# Patient Record
Sex: Male | Born: 1965
Health system: Southern US, Community
[De-identification: ages and names within clinical notes are randomized; demographics above are authoritative.]

## PROBLEM LIST (undated history)

## (undated) DIAGNOSIS — G4730 Sleep apnea, unspecified: Secondary | ICD-10-CM

## (undated) DIAGNOSIS — E785 Hyperlipidemia, unspecified: Secondary | ICD-10-CM

## (undated) DIAGNOSIS — E669 Obesity, unspecified: Secondary | ICD-10-CM

## (undated) DIAGNOSIS — I10 Essential (primary) hypertension: Secondary | ICD-10-CM

## (undated) HISTORY — DX: Hyperlipidemia, unspecified: E78.5

## (undated) HISTORY — DX: Sleep apnea, unspecified: G47.30

## (undated) HISTORY — DX: Essential (primary) hypertension: I10

## (undated) HISTORY — DX: Obesity, unspecified: E66.9

---
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
## 2006-11-15 ENCOUNTER — Emergency Department: Payer: Self-pay | Admitting: Emergency Medicine

## 2007-02-10 ENCOUNTER — Emergency Department: Payer: Self-pay | Admitting: Emergency Medicine

## 2007-04-14 ENCOUNTER — Emergency Department: Payer: Self-pay | Admitting: Emergency Medicine

## 2007-07-01 ENCOUNTER — Inpatient Hospital Stay: Payer: Self-pay | Admitting: Internal Medicine

## 2009-03-31 ENCOUNTER — Ambulatory Visit: Payer: Self-pay | Admitting: Internal Medicine

## 2010-02-10 ENCOUNTER — Ambulatory Visit: Payer: Self-pay | Admitting: Internal Medicine

## 2010-12-26 ENCOUNTER — Ambulatory Visit: Payer: Self-pay | Admitting: Internal Medicine

## 2012-06-18 ENCOUNTER — Ambulatory Visit: Payer: Self-pay | Admitting: Internal Medicine

## 2012-08-16 ENCOUNTER — Ambulatory Visit: Payer: Self-pay

## 2012-08-26 ENCOUNTER — Ambulatory Visit: Payer: Self-pay | Admitting: Physician Assistant

## 2012-09-16 ENCOUNTER — Ambulatory Visit: Payer: Self-pay

## 2013-10-01 ENCOUNTER — Inpatient Hospital Stay: Payer: Self-pay | Admitting: Internal Medicine

## 2013-10-01 LAB — BASIC METABOLIC PANEL
Anion Gap: 7 (ref 7–16)
BUN: 14 mg/dL (ref 7–18)
Chloride: 108 mmol/L — ABNORMAL HIGH (ref 98–107)
Co2: 27 mmol/L (ref 21–32)
Creatinine: 0.91 mg/dL (ref 0.60–1.30)
EGFR (African American): >60
EGFR (Non-African Amer.): >60
Glucose: 138 mg/dL — ABNORMAL HIGH (ref 65–99)
Osmolality: 286 (ref 275–301)
Potassium: 3.5 mmol/L (ref 3.5–5.1)

## 2013-10-01 LAB — CBC
HGB: 14.4 g/dL (ref 13.0–18.0)
MCH: 32.7 pg (ref 26.0–34.0)
MCV: 97 fL (ref 80–100)
Platelet: 214 10*3/uL (ref 150–440)
RBC: 4.42 10*6/uL (ref 4.40–5.90)
RDW: 13.5 % (ref 11.5–14.5)

## 2013-10-01 LAB — CK TOTAL AND CKMB (NOT AT ARMC)
CK, Total: 122 U/L (ref 35–232)
CK, Total: 126 U/L (ref 35–232)
CK, Total: 124 U/L (ref 35–232)
CK-MB: 1.9 ng/mL (ref 0.5–3.6)
CK-MB: 1.7 ng/mL (ref 0.5–3.6)
CK-MB: 2.1 ng/mL (ref 0.5–3.6)

## 2013-10-01 LAB — PRO B NATRIURETIC PEPTIDE: B-Type Natriuretic Peptide: 357 pg/mL — ABNORMAL HIGH (ref 0–125)

## 2013-10-01 LAB — TROPONIN I: Troponin-I: <0.02 ng/mL

## 2013-10-02 LAB — COMPREHENSIVE METABOLIC PANEL
Alkaline Phosphatase: 123 U/L — ABNORMAL HIGH
Anion Gap: 6 — ABNORMAL LOW (ref 7–16)
BUN: 20 mg/dL — ABNORMAL HIGH (ref 7–18)
Calcium, Total: 8.9 mg/dL (ref 8.5–10.1)
Co2: 31 mmol/L (ref 21–32)
Creatinine: 1.13 mg/dL (ref 0.60–1.30)
EGFR (African American): >60
EGFR (Non-African Amer.): >60
Glucose: 104 mg/dL — ABNORMAL HIGH (ref 65–99)
Osmolality: 282 (ref 275–301)
Potassium: 3.2 mmol/L — ABNORMAL LOW (ref 3.5–5.1)
SGPT (ALT): 31 U/L (ref 12–78)
Sodium: 140 mmol/L (ref 136–145)

## 2013-10-02 LAB — LIPID PANEL
Cholesterol: 117 mg/dL (ref 0–200)
HDL Cholesterol: 29 mg/dL — ABNORMAL LOW (ref 40–60)
Ldl Cholesterol, Calc: 38 mg/dL (ref 0–100)
VLDL Cholesterol, Calc: 50 mg/dL — ABNORMAL HIGH (ref 5–40)

## 2013-10-09 ENCOUNTER — Ambulatory Visit: Payer: Self-pay

## 2014-03-06 LAB — COMPREHENSIVE METABOLIC PANEL
ALBUMIN: 2.6 g/dL — AB (ref 3.4–5.0)
ALK PHOS: 147 U/L — AB
ALT: 45 U/L (ref 12–78)
ANION GAP: 6 — AB (ref 7–16)
AST: 62 U/L — AB (ref 15–37)
BILIRUBIN TOTAL: 0.6 mg/dL (ref 0.2–1.0)
BUN: 29 mg/dL — ABNORMAL HIGH (ref 7–18)
CALCIUM: 8.3 mg/dL — AB (ref 8.5–10.1)
CO2: 25 mmol/L (ref 21–32)
Chloride: 104 mmol/L (ref 98–107)
Creatinine: 1.45 mg/dL — ABNORMAL HIGH (ref 0.60–1.30)
EGFR (Non-African Amer.): 57 — ABNORMAL LOW
GLUCOSE: 174 mg/dL — AB (ref 65–99)
Osmolality: 280 (ref 275–301)
Potassium: 3.3 mmol/L — ABNORMAL LOW (ref 3.5–5.1)
Sodium: 135 mmol/L — ABNORMAL LOW (ref 136–145)
Total Protein: 6.7 g/dL (ref 6.4–8.2)

## 2014-03-06 LAB — CBC
HCT: 37.7 % — AB (ref 40.0–52.0)
HGB: 12.9 g/dL — ABNORMAL LOW (ref 13.0–18.0)
MCH: 32.8 pg (ref 26.0–34.0)
MCHC: 34.2 g/dL (ref 32.0–36.0)
MCV: 96 fL (ref 80–100)
PLATELETS: 143 10*3/uL — AB (ref 150–440)
RBC: 3.93 10*6/uL — ABNORMAL LOW (ref 4.40–5.90)
RDW: 13.2 % (ref 11.5–14.5)
WBC: 7.8 10*3/uL (ref 3.8–10.6)

## 2014-03-07 ENCOUNTER — Inpatient Hospital Stay: Payer: Self-pay | Admitting: Internal Medicine

## 2014-03-07 LAB — URINALYSIS, COMPLETE
BILIRUBIN, UR: NEGATIVE
Bacteria: NONE SEEN
GLUCOSE, UR: NEGATIVE mg/dL (ref 0–75)
KETONE: NEGATIVE
Leukocyte Esterase: NEGATIVE
NITRITE: NEGATIVE
Ph: 5 (ref 4.5–8.0)
Specific Gravity: 1.023 (ref 1.003–1.030)
Squamous Epithelial: 1
WBC UR: 7 /HPF (ref 0–5)

## 2014-03-08 LAB — BASIC METABOLIC PANEL
ANION GAP: 6 — AB (ref 7–16)
BUN: 14 mg/dL (ref 7–18)
CALCIUM: 8.1 mg/dL — AB (ref 8.5–10.1)
Chloride: 104 mmol/L (ref 98–107)
Co2: 25 mmol/L (ref 21–32)
Creatinine: 1.43 mg/dL — ABNORMAL HIGH (ref 0.60–1.30)
EGFR (African American): >60
GFR CALC NON AF AMER: 58 — AB
Glucose: 109 mg/dL — ABNORMAL HIGH (ref 65–99)
Osmolality: 271 (ref 275–301)
Potassium: 3.5 mmol/L (ref 3.5–5.1)
Sodium: 135 mmol/L — ABNORMAL LOW (ref 136–145)

## 2014-03-08 LAB — CBC WITH DIFFERENTIAL/PLATELET
BASOS ABS: 0 10*3/uL (ref 0.0–0.1)
Basophil %: 0.4 %
EOS ABS: 0 10*3/uL (ref 0.0–0.7)
Eosinophil %: 0.1 %
HCT: 32.9 % — ABNORMAL LOW (ref 40.0–52.0)
HGB: 11.2 g/dL — AB (ref 13.0–18.0)
LYMPHS ABS: 1.3 10*3/uL (ref 1.0–3.6)
LYMPHS PCT: 14.1 %
MCH: 33 pg (ref 26.0–34.0)
MCHC: 34.1 g/dL (ref 32.0–36.0)
MCV: 97 fL (ref 80–100)
Monocyte #: 1.1 x10 3/mm — ABNORMAL HIGH (ref 0.2–1.0)
Monocyte %: 12 %
NEUTROS ABS: 6.7 10*3/uL — AB (ref 1.4–6.5)
Neutrophil %: 73.4 %
Platelet: 124 10*3/uL — ABNORMAL LOW (ref 150–440)
RBC: 3.4 10*6/uL — ABNORMAL LOW (ref 4.40–5.90)
RDW: 13.1 % (ref 11.5–14.5)
WBC: 9.2 10*3/uL (ref 3.8–10.6)

## 2014-03-09 LAB — VANCOMYCIN, TROUGH: Vancomycin, Trough: 9 ug/mL — ABNORMAL LOW (ref 10–20)

## 2014-03-10 LAB — CREATININE, SERUM
Creatinine: 1.14 mg/dL (ref 0.60–1.30)
EGFR (African American): >60

## 2014-03-10 LAB — VANCOMYCIN, TROUGH: Vancomycin, Trough: 13 ug/mL (ref 10–20)

## 2014-03-11 LAB — VANCOMYCIN, TROUGH: Vancomycin, Trough: 26 ug/mL (ref 10–20)

## 2014-03-12 LAB — CBC WITH DIFFERENTIAL/PLATELET
Basophil #: 0 x10 3/mm 3
Basophil %: 0.5 %
Eosinophil #: 0.1 x10 3/mm 3
Eosinophil %: 1.7 %
HCT: 32.5 % — ABNORMAL LOW
HGB: 11.1 g/dL — ABNORMAL LOW
Lymphocyte %: 20.4 %
Lymphs Abs: 1.8 x10 3/mm 3
MCH: 33.6 pg
MCHC: 34.1 g/dL
MCV: 99 fL
Monocyte #: 1.4 "x10 3/mm " — ABNORMAL HIGH
Monocyte %: 15.5 %
Neutrophil #: 5.4 x10 3/mm 3
Neutrophil %: 61.9 %
Platelet: 251 x10 3/mm 3
RBC: 3.3 x10 6/mm 3 — ABNORMAL LOW
RDW: 13.8 %
WBC: 8.8 x10 3/mm 3

## 2014-03-12 LAB — CREATININE, SERUM
Creatinine: 1.24 mg/dL
EGFR (African American): >60
EGFR (Non-African Amer.): >60

## 2014-03-12 LAB — CULTURE, BLOOD (SINGLE)

## 2014-03-25 ENCOUNTER — Ambulatory Visit: Payer: Self-pay | Admitting: Physician Assistant

## 2015-02-12 NOTE — Discharge Summary (Signed)
PATIENT NAME:  Bradley Velez, Bradley Velez MR#:  [id number] DATE OF BIRTH:  08/15/1966  DATE OF ADMISSION:  10/01/2013 DATE OF DISCHARGE:  10/02/2013  For a detailed note, please take a look at the history and physical on admission by Dr. Angelica Ranavid Hower.   DIAGNOSES AT DISCHARGE: As follows:  1.  Acute on chronic congestive heart failure, likely combined systolic and diastolic dysfunction. 2.  Hypertension. 3.  Pulmonary hypertension. 4.  Morbid obesity. 5.  Obstructive sleep apnea. 6.  Hyperlipidemia.   DIET: The patient is being discharged on a low-sodium, low-fat diet.   ACTIVITY: As tolerated.   FOLLOWUP With Dr. Arnoldo HookerBruce Kowalski and Oliver HumHeather Hiles, NP at [facility] in the next 1 to 2 weeks.   DISCHARGE MEDICATIONS: Coreg 3.125 mg b.i.d., lovastatin 40 mg daily, omega-3 fatty acids daily, Lasix 40 mg b.i.d., Cozaar 25 mg daily and  potassium 20 mEq daily.   CONSULTANTS DURING THE HOSPITAL COURSE: Dr. Arnoldo HookerBruce Kowalski from cardiology.   PERTINENT STUDIES DONE DURING THE HOSPITAL COURSE: A chest x-ray done on admission showing cardiomegaly and mild interstitial edema. A 2-dimensional echocardiogram done on December 10 showing left ventricular ejection fraction to be 25% to 30%, moderate to severely decreased global LV systolic function, mildly dilated left atrium and right atrium, moderate mitral valve regurgitation and moderate tricuspid regurgitation.   BRIEF HOSPITAL COURSE: This is a 49 year old male with medical problems as mentioned above, presented to the hospital with shortness of breath and chest pain.   1.  Congestive heart failure. This was likely the cause of the patient's shortness of breath. The patient does have a history of pulmonary hypertension and diastolic congestive heart failure and was not on any diuretics prior to coming in. The patient was started on IV diuresis with Lasix and maintained on his beta blockers. He has clinically significantly improved since admission. The patient had  an echocardiogram done, which showed ejection fraction of about 25% to 30%, which was new from his previous echocardiogram. Therefore, there was a concern for possible underlying systolic dysfunction. Therefore, the patient underwent a cardiac catheterization to rule out coronary artery disease. The patient's cath did not show any evidence of any significant coronary artery disease. His cardiac markers x 3 were negative. Presently, the patient is being discharged on optimal meds for his CHF including Coreg and Cozaar as he is intolerant to ACE inhibitors with a cough along with diuretics and potassium supplements. He will have close followup with his cardiologist as an outpatient. The patient is also strongly advised to be compliant with his CPAP.  2.  Hypertension. The patient remained hemodynamically stable on his Coreg. Low-dose Cozaar was added to help with his congestive heart failure treatment.  3.  Obstructive sleep apnea. The patient was maintained on his CPAP and he will continue that.  4.  Chest pain. The likely cause of his chest pain was due to congestive heart failure. His cardiac markers x 3 were negative. His echo did show LV dysfunction, but his cardiac catheterization did not show any significant coronary artery disease.  5.  Hyperlipidemia. The patient was maintained on his lovastatin and Omega 3 supplements. He will resume that.   CODE STATUS: The patient is a full code.   TIME SPENT: 40 minutes.  ____________________________ Bradley PancakeVivek J. Cherlynn KaiserSainani, MD vjs:aw D: 10/02/2013 14:20:31 ET T: 10/02/2013 14:53:48 ET JOB#: [id number]  cc: Bradley PancakeVivek J. Cherlynn KaiserSainani, MD, <Dictator> Lamar BlinksBruce J. Kowalski, MD Oliver HumHEATHER HILES, NP [place] SirenVIVEK J Sahej Schrieber MD ELECTRONICALLY SIGNED 10/16/2013 13:39

## 2015-02-12 NOTE — Consult Note (Signed)
PATIENT NAME:  Bradley Velez, Bradley Velez MR#:  [id number] DATE OF BIRTH:  26-May-1966  DATE OF CONSULTATION:  10/01/2013  REFERRING PHYSICIAN:  Rolly Pancake. Cherlynn Kaiser, MD CONSULTING PHYSICIAN:  Lamar Blinks, MD  REASON FOR CONSULTATION: Acute on chronic heart failure, sleep apnea, hypertension.   HISTORY OF PRESENT ILLNESS:  The patient has had significant weight gain over last several weeks to a month of approximately 30 pounds and has had a recent echocardiogram several months prior showing normal LV systolic function with some mild valvular heart disease and minimal pulmonary hypertension. He does have sleep apnea for which he has had appropriate CPAP use with recent evaluation showing it is working well. Hypertension is controlled with current medical regimen. The patient has had severe shortness of breath and hypoxia, and was seen in the Emergency Room with pulmonary edema, a BNP of 357 and a normal troponin with an EKG showing normal sinus rhythm and no evidence of myocardial infarction. With intravenous Lasix the patient had significant improvements and has had improvements with the shortness of breath.  He still has some lower extremity edema.   REVIEW OF SYSTEMS:  Remainder of review of systems negative for vision change, ringing in the ears, hearing loss, cough, congestion, heartburn, nausea, vomiting, diarrhea, bloody stools, stomach pain, extremity pain, leg weakness, cramping of the buttocks, known blood clots, headaches, blackouts, dizzy spells, nosebleeds, trouble swallowing, frequent urination, urination at night, muscle weakness, numbness, anxiety, depression, skin lesions or skin rashes.   PAST MEDICAL HISTORY: 1.  Hypertension.  2.  Sleep apnea.  3.  Valvular heart disease.   FAMILY HISTORY: No family members with early onset of cardiovascular disease or hypertension.   SOCIAL HISTORY: Currently denies alcohol or tobacco use.   ALLERGIES TO MEDICATIONS:  AS LISTED.   PHYSICAL  EXAMINATION: VITAL SIGNS: Blood pressure is 115/68 bilaterally, heart rate 86 upright, reclining and regular.  GENERAL: He is a well appearing male in no acute distress.  HEENT: No icterus, thyromegaly, ulcers, hemorrhage or xanthelasma.  CARDIOVASCULAR: Regular rate and rhythm. Normal S1 and S2. PMI (Dictation    without murmurs, rubs or gallops.  Carotid upstroke cannot be assessed due to significant thickness of the neck.  LUNGS: Have obvious crackles. ABDOMEN: Obese and cannot assess hepatosplenomegaly or masses.  EXTREMITIES: Have 2+ radial, no apparent femoral or dorsal pedal pulses with 2+ edema. No cyanosis, clubbing or ulcers.  NEUROLOGIC: He is oriented to time, place and person with normal mood and affect.   Echocardiogram shows severe LV systolic dysfunction with ejection fraction of 25% to 30% consistent with systolic dysfunction congestive heart failure.   ASSESSMENT: A 49 year old male with sleep apnea, hypertension, who has acute systolic dysfunction congestive heart failure without evidence of myocardial infarction or acute coronary syndrome.   RECOMMENDATIONS: 1.  Continue CPAP machine with sleep apnea or possible BiPAP machine, whichever works better for hypoxia.  2.  Hypertension control with beta blocker if able.  3.  Diuresis with Lasix.  4.  Proceed to cardiac catheterization with right and left heart catheterization for further evaluation of pulmonary hypertension and severe acute congestive heart failure for coronary artery disease.  The patient understands the risks and benefits of cardiac catheterization. These include the possibility of stroke, heart attack and  blood clot. The patient is at low risk for conscious sedation.   ____________________________ Lamar Blinks, MD bjk:cs D: 10/01/2013 14:21:00 ET T: 10/01/2013 14:46:06 ET JOB#: [id number]  cc: Lamar Blinks, MD, <Dictator> Lamar Blinks  MD ELECTRONICALLY SIGNED 10/02/2013 16:54

## 2015-02-12 NOTE — H&P (Signed)
PATIENT NAME:  Bradley Velez, Bradley Velez MR#:  [id number] DATE OF BIRTH:  [birth year]  DATE OF ADMISSION:  10/01/2013  REFERRING PHYSICIAN: Dr. Zenda AlpersWebster.   PRIMARY CARE PHYSICIAN: Dr. Dwaine GaleHiles.  CHIEF COMPLAINT: Shortness of breath.  HISTORY OF PRESENT ILLNESS: This 49 year old Caucasian gentleman with past medical history of hyperlipidemia, obstructive sleep apnea, compliant with CPAP therapy, presenting with shortness of breath. He awoke from sleep with shortness of breath, noted to have gargling sounding breathing, despite using his CPAP. He noticed associated cough for one day duration, productive of pink frothy sputum as well as lower extremity edema, worsening for approximately one week, despite usage of compression stockings. He also has orthopnea, sleeping with four pillows; however, denies any chest pain, palpitations, fever, chills. He was found to be hypoxemic on room air, saturating 88% on arrival to the ED. Currently, he is on BiPAP and without complaints.    REVIEW OF SYSTEMS:  CONSTITUTIONAL: Denies fevers, chills, fatigue, weakness.  EYES: Denies blurred vision, double vision, eye pain.  EARS, NOSE, THROAT: Denies tinnitus, ear pain, hearing loss.  RESPIRATORY: Positive for productive cough as described above as well as shortness of breath.  CARDIOVASCULAR: Denies chest pain, palpitations. Positive for orthopnea and edema.  GASTROINTESTINAL: Denies nausea, vomiting, diarrhea, abdominal pain.  GENITOURINARY: Denies dysuria, hematuria.  ENDOCRINE: Denies nocturia or thyroid problems.  HEMATOLOGIC AND LYMPHATIC: Denies bruising or bleeding.  SKIN: Denies rash or lesions.  MUSCULOSKELETAL: Denies pain in neck, back, shoulder, knees, hips, arthritic symptoms.  NEUROLOGIC: Denies paralysis, paresthesias.  PSYCHIATRIC: Denies anxiety or depressive symptoms.  Otherwise, full review of systems performed by me is negative.   PAST MEDICAL HISTORY: Hyperlipidemia, obstructive sleep apnea on CPAP  therapy.   SOCIAL HISTORY: Denies any tobacco usage. Positive for rare alcohol usage. Denies any drug usage.   FAMILY HISTORY: Positive for chronic obstructive pulmonary disease and lung cancer in his father.   ALLERGIES: NO KNOWN DRUG ALLERGIES.   HOME MEDICATIONS: Include Coreg 3.125 mg p.o. b.i.d., lovastatin 40 mg p.o. daily, omega-3 fatty acid 1 gram p.o. b.i.d.   PHYSICAL EXAMINATION: VITAL SIGNS: Temperature 98.4, heart rate 98, respirations 24, blood pressure 123/87, saturating 98% on BiPAP therapy at 12/6. Weight 149.6 kg, BMI 53.3.  GENERAL: Obese Caucasian gentleman, currently in minimal to moderate distress secondary to respiratory status, requiring BiPAP therapy.  HEAD: Normocephalic, atraumatic.  EYES: Pupils equal, round reactive to light. Extraocular muscles are intact. No scleral icterus.  MOUTH: Moist mucous membranes. Dentition intact. No abscess noted.  EARS, NOSE, AND THROAT: Throat clear without exudates. No external lesions.  NECK: Supple. No thyromegaly. No nodules. Difficult to assess JVD secondary to body habitus.  PULMONARY: Greatly diminished breath sounds throughout lung fields with associated coarse breath sounds. No use of accessory muscles. Good respiratory effort, though currently on BiPAP therapy.  CHEST: Nontender to palpation.  CARDIOVASCULAR: S1, S2, regular rate and rhythm. No murmurs, rubs, or gallops, though extremely difficult to auscultate, given breath sounds, BiPAP therapy and body habitus. Edema 2+ bilaterally. Lower extremities pedal pulses 2+ bilaterally.  GASTROINTESTINAL: Soft, nontender, nondistended. No masses. Obese. Positive bowel sounds. No hepatosplenomegaly.  MUSCULOSKELETAL: No swelling, clubbing. Positive for edema as described above. Range of motion full in all extremities.  NEUROLOGIC: Cranial nerves II through XII intact. No gross focal neurological deficits. Sensation intact. Reflexes intact.  SKIN: No ulcerations, lesions, rashes,  cyanosis. Skin warm, dry. Turgor is intact.  PSYCHIATRIC: Mood and affect within normal limits. The patient is awake, alert, oriented x3. Insight  and judgment intact.   LABORATORY DATA: Sodium 142, potassium 3.5, chloride 108, bicarbonate 27, BUN 14, creatinine 0.91, glucose 138. BNP 357, troponin I less than 0.02. WBC 7.9, hemoglobin 14.4, platelets of 214. EKG revealing normal sinus rhythm, heart rate 97. Chest x-ray revealing cardiomegaly with interstitial edema.   ASSESSMENT AND PLAN: A 49 year old gentleman with history of hyperlipidemia, obstructive sleep apnea, presenting with shortness of breath.  1.  Congestive heart failure exacerbation, unclear at this time whether this is systolic or diastolic. Continue with BiPAP therapy, wean as tolerated. Continue supplemental oxygen to keep oxygen saturation greater than 92%. Continue IV Lasix 40 mg IV daily. Continue with daily weights and strict intake and output. We will check transthoracic echocardiogram, consult Dr. Gwen Pounds, his home cardiologist, trend cardiac enzymes, admit to cardiac telemetry.  2.  Acute respiratory failure with hypoxia. Continue BiPAP therapy. Wean as tolerated. This is due to his congestive heart failure.  3.  Obstructive sleep apnea on BiPAP therapy. When weaned off, continue with his home nightly CPAP therapy.  4.  Hyperlipidemia. Continue with lovastatin.  5.  Deep venous prophylaxis with heparin subcutaneous.   CODE STATUS: The patient is full code.   TIME SPENT: 45 minutes.    ____________________________ Cletis Athens. Hower, MD dkh:cg D: 10/01/2013 02:53:43 ET T: 10/01/2013 03:12:50 ET JOB#: [id number]  cc: Cletis Athens. Hower, MD, <Dictator> DAVID Synetta Shadow MD ELECTRONICALLY SIGNED 10/08/2013 20:16

## 2015-02-13 NOTE — H&P (Signed)
PATIENT NAME:  Bradley Velez, [name]:  [id number] DATE OF BIRTH:  06-27-1966  DATE OF ADMISSION:  03/07/2014  PRIMARY CARE PHYSICIAN: Dr. Beverely RisenFozia Khan.   REFERRING PHYSICIAN: Dr. Lucrezia EuropeAllison Webster.   CHIEF COMPLAINT: Left leg redness and swelling, pain.   HISTORY OF PRESENT ILLNESS: Bradley Velez is a 49 year old with a past medical history of congestive heart failure with EF of 30% per patient, obesity, hyperlipidemia, presented to the Emergency Department with complaints of left leg redness. The patient, usually starts to notice swelling in the foot, usually takes Keflex with improvement. The patient noticed redness and pain and not feeling well on Wednesday. By afternoon, the patient was having fever. The patient's wife noticed him to have some cellulitis, gave Keflex without much improvement. In the last 2 days it significantly worsened with severe redness and swelling of the left leg. Concerning this, came to the Emergency Department. Workup in the Emergency Department: Venous Dopplers negative for any DVT. CT without contrast done negative for any  or fluid collection. The patient received vancomycin and Zosyn in the Emergency Department.    PAST MEDICAL HISTORY:  1. Obstructive sleep apnea.  2. Hyperlipidemia.  3. Cardiomyopathy with EF of 30%.  4. Congestive heart failure, systolic chronic.  5. Chronic venous stasis.  6. Recurrent episodes of cellulitis.   PAST SURGICAL HISTORY: None.   ALLERGIES: No known drug allergies.   HOME MEDICATIONS: Potassium chloride.   PAST SURGICAL HISTORY: Eye surgery as a child.   HOME MEDICATIONS:  1. Potassium chloride 20 mEq once a day.  2. Omega-3 fatty acids.  3. Lovastatin 40 mg once a day.  4. Lasix 40 mg 2 times a day. 5. Cozaar 25 mg daily.  6. Coreg 3.125 mg 2 times a day.   SOCIAL HISTORY: No history of smoking, or drinking alcohol or using illicit drugs. Married, lives with his wife. Works for [employer].   FAMILY HISTORY: Positive for  COPD and lung cancer in father.   REVIEW OF SYSTEMS:  CONSTITUTIONAL: Has fever, generalized weakness.  EYES: No change in vision.  ENT: No change in hearing.  RESPIRATORY: No cough, shortness of breath.  CARDIOVASCULAR: No chest pain, palpitations.  GASTROINTESTINAL: No nausea, vomiting, abdominal pain.  GENITOURINARY: No dysuria or hematuria. HEMATOLOGIC: No easy bruising or bleeding.  ENDOCRINE: No polyuria or polydipsia.  SKIN: Has redness and swelling of the left leg.  MUSCULOSKELETAL: Has pain in the left leg.  NEUROLOGIC: No weakness or numbness in any part of the body.   PHYSICAL EXAMINATION:  GENERAL: This is a well-built, well-nourished, age-appropriate male lying down in the bed, not in distress.  VITAL SIGNS: Temperature 100.1, pulse 105, blood pressure 94/62, respiratory rate of 18, oxygen saturation is 96% on room air.  HEENT: Head normocephalic, atraumatic. No scleral icterus. Conjunctivae normal. Pupils equal and react to light. Extraocular movements are intact. Mucous membranes moist. No pharyngeal erythema.  NECK: Supple. No lymphadenopathy. No JVD. No carotid bruit. No thyromegaly.  CHEST: Has no focal tenderness.  LUNGS: Bilaterally clear to auscultation.  HEART: S1, S2 regular. No murmurs are heard. Tachycardia.  ABDOMEN: Obese. Bowel sounds present. Soft, nontender, nondistended. Could not appreciate any hepatosplenomegaly.  EXTREMITIES: Left lower extremity has significant redness and swelling extending up below knee from the foot. Could not appreciate pulses; however, has good capillary refill.  SKIN: As mentioned above, has significant redness and swelling.  MUSCULOSKELETAL: Has good range of motion in all the extremities.   NEUROLOGIC: The patient is  alert, oriented to place, person, and time. Cranial nerves II-XII intact. Motor 5/5 in upper and lower extremities.   LABORATORY DATA: CMP: BUN 29, creatinine of , potassium of 3.3. AST 62.   CBC: WBC of 7.8,  hemoglobin 12.9, platelet count of 143,000. Urinalysis negative for nitrites and leukocyte esterase.   CT of the lower extremity: Interstitial edema of the subcutaneous fat with skin thickening. May reflect cellulitis without focal fluid collection or subcutaneous gas.   Lower extremity Dopplers negative for any DVT.   ASSESSMENT AND PLAN: Bradley Velez is a 49 year old male who comes with severe cellulitis of the left leg.  1. Cellulitis. Keep the patient on vancomycin and Zosyn until cultures are back. 2. Sepsis secondary to cellulitis and followup with the blood cultures. Continue with antibiotics.  3. Hypokalemia: Replaced by mouth.  4. Acute renal insufficiency. Will hold the Cozaar and Lasix for now. Continue with IV fluids and follow up.  5. Hyperlipidemia. Will hold the lovastatin as the patient has mild elevation of the LFTs. 6. Keep the patient on deep vein thrombosis prophylaxis with Lovenox.   TIME SPENT: 55 minutes.   ____________________________ Susa Griffins, MD pv:lt D: 03/07/2014 04:30:21 ET T: 03/07/2014 05:09:14 ET JOB#: [id number]  cc: Susa Griffins, MD, <Dictator> Lyndon Code, MD Clerance Lav Karleen Seebeck MD ELECTRONICALLY SIGNED 03/20/2014 0:43

## 2015-02-13 NOTE — Discharge Summary (Signed)
PATIENT NAME:  Bradley Velez, Little MR#:  [id number] DATE OF BIRTH:  Dec 06, 1965  DATE OF ADMISSION:  03/07/2014 DATE OF DISCHARGE:  03/12/2014  DISCHARGE DIAGNOSES: 1.  Sepsis due to leg cellulitis, improved on antibiotic, still has some swelling on the lower extremity for which Ace wrap has been recommended, considering history of lymphedema.  2.  Hypotension with a history of hypertension, improving with adjustment in the blood pressure medicine.   SECONDARY DIAGNOSES: 1.  Hyperlipidemia.  2.  Obstructive sleep apnea.  3.  Cardiomyopathy with ejection fraction of 30%.  4.  Chronic systolic heart failure. 5.  Chronic venous stasis.  6.  Recurrent episodes of cellulitis.   CONSULTATIONS:  None.   PROCEDURES AND RADIOLOGY:   1.  CT scan of the lower extremities on 16th of May showed interstitial edema with history of cellulitis.  2.  Left lower extremity Doppler on 16th of May showed no DVT.  Interval mild decrease in the size of popliteal cyst.  No evidence of DVT.   3.  Chest x-ray on 20th of May showed mild CHF with interstitial edema.  4.  Urinalysis on admission showed 7 WBCs, otherwise negative.  5.  Blood cultures x 2 were negative on 16th of May.   HISTORY AND SHORT HOSPITAL COURSE:  The patient is a 49 year old male with above-mentioned medical problems, was admitted for cellulitis and features consistent with sepsis.  Please see Dr. Clarita LeberVasireddy's dictated history and physical for further details.  He was started on IV antibiotics and was slowly improving.  He also had significant lower extremity edema for which Ace wrap bandage was started and was helping with edema along with leg swelling.  He was slowly improving and was close to his baseline on 20th of May with significant improvement in his lower extremity cellulitis.  He did have some swelling which was also improving and was discharged home 21st of May in stable condition.   PHYSICAL EXAMINATION: VITAL SIGNS:  On the date of  discharge are as follows:  Temperature 98, heart rate 70 per minute, respirations 18 per minute, blood pressure 107/58 mmHg.  He was saturating 91% on room air.  Pertinent physical examination on the date of discharge:  CARDIOVASCULAR:  S1, S2 normal.  No murmurs, rubs or gallop.  LUNGS:  Clear to auscultation bilaterally.  No wheezing, rales, rhonchi, or crepitation.  ABDOMEN:  Soft, benign.  NEUROLOGIC:  Nonfocal examination.  LOWER EXTREMITIES:  On the left side had minimal erythema, mainly on the lateral side.  The medial side had already started clearing up.  He still had moderate edema, significant portion of that seemed to be more of a lymphedema rather than the thickening of the skin.  All other physical examination remained at the baseline.  There was no warmth of that area on the left lower extremity.   DISCHARGE MEDICATIONS: 1.  Lovastatin 40 mg by mouth daily. 2.  Omega 3 fatty acid 1 cap twice a day.  3.  Lasix 40 mg by mouth twice daily.  4.  Potassium chloride 20 mEq by mouth daily.  5.  Coreg 3.125 mg by mouth at bedtime.  6.  Cozaar 25 mg 1/2 tablet by mouth daily.  7.  Keflex 500 mg by mouth 3 times a day for five more days.   DISCHARGE DIET:  Low sodium.   DISCHARGE ACTIVITY:  As tolerated.   DISCHARGE INSTRUCTIONS AND FOLLOWUP:  The patient was instructed to follow up with his primary care physician  Dr. Beverely Risen in 1 to 2 weeks.   Total time discharging this patient was 40 minutes.    ____________________________ Ellamae Sia. Sherryll Burger, MD vss:ea D: 03/13/2014 22:10:46 ET T: 03/14/2014 06:12:45 ET JOB#: [id number]  cc: Muad Noga S. Sherryll Burger, MD, <Dictator> Lyndon Code, MD Ellamae Sia [facility] MD ELECTRONICALLY SIGNED 03/17/2014 20:42

## 2016-01-25 DIAGNOSIS — Z0001 Encounter for general adult medical examination with abnormal findings: Secondary | ICD-10-CM | POA: Diagnosis not present

## 2016-01-25 DIAGNOSIS — I499 Cardiac arrhythmia, unspecified: Secondary | ICD-10-CM | POA: Diagnosis not present

## 2016-01-25 DIAGNOSIS — L03116 Cellulitis of left lower limb: Secondary | ICD-10-CM | POA: Diagnosis not present

## 2016-01-25 DIAGNOSIS — I10 Essential (primary) hypertension: Secondary | ICD-10-CM | POA: Diagnosis not present

## 2016-01-27 DIAGNOSIS — R7301 Impaired fasting glucose: Secondary | ICD-10-CM | POA: Diagnosis not present

## 2016-01-27 DIAGNOSIS — E782 Mixed hyperlipidemia: Secondary | ICD-10-CM | POA: Diagnosis not present

## 2016-01-27 DIAGNOSIS — Z125 Encounter for screening for malignant neoplasm of prostate: Secondary | ICD-10-CM | POA: Diagnosis not present

## 2016-01-27 DIAGNOSIS — Z0001 Encounter for general adult medical examination with abnormal findings: Secondary | ICD-10-CM | POA: Diagnosis not present

## 2016-01-27 DIAGNOSIS — I10 Essential (primary) hypertension: Secondary | ICD-10-CM | POA: Diagnosis not present

## 2016-01-28 DIAGNOSIS — I499 Cardiac arrhythmia, unspecified: Secondary | ICD-10-CM | POA: Diagnosis not present

## 2016-02-27 DIAGNOSIS — J019 Acute sinusitis, unspecified: Secondary | ICD-10-CM | POA: Diagnosis not present

## 2016-02-27 DIAGNOSIS — I10 Essential (primary) hypertension: Secondary | ICD-10-CM | POA: Diagnosis not present

## 2016-02-27 DIAGNOSIS — R05 Cough: Secondary | ICD-10-CM | POA: Diagnosis not present

## 2016-03-08 DIAGNOSIS — R0602 Shortness of breath: Secondary | ICD-10-CM | POA: Diagnosis not present

## 2016-03-16 DIAGNOSIS — I5032 Chronic diastolic (congestive) heart failure: Secondary | ICD-10-CM | POA: Diagnosis not present

## 2016-03-16 DIAGNOSIS — E782 Mixed hyperlipidemia: Secondary | ICD-10-CM | POA: Diagnosis not present

## 2016-03-16 DIAGNOSIS — I10 Essential (primary) hypertension: Secondary | ICD-10-CM | POA: Diagnosis not present

## 2016-03-16 DIAGNOSIS — I499 Cardiac arrhythmia, unspecified: Secondary | ICD-10-CM | POA: Diagnosis not present

## 2016-04-05 DIAGNOSIS — R509 Fever, unspecified: Secondary | ICD-10-CM | POA: Diagnosis not present

## 2016-04-05 DIAGNOSIS — J060 Acute laryngopharyngitis: Secondary | ICD-10-CM | POA: Diagnosis not present

## 2016-04-18 DIAGNOSIS — G4733 Obstructive sleep apnea (adult) (pediatric): Secondary | ICD-10-CM | POA: Diagnosis not present

## 2016-04-20 DIAGNOSIS — B370 Candidal stomatitis: Secondary | ICD-10-CM | POA: Diagnosis not present

## 2016-04-20 DIAGNOSIS — J060 Acute laryngopharyngitis: Secondary | ICD-10-CM | POA: Diagnosis not present

## 2016-10-05 DIAGNOSIS — I10 Essential (primary) hypertension: Secondary | ICD-10-CM | POA: Diagnosis not present

## 2016-10-05 DIAGNOSIS — L03116 Cellulitis of left lower limb: Secondary | ICD-10-CM | POA: Diagnosis not present

## 2016-10-05 DIAGNOSIS — E782 Mixed hyperlipidemia: Secondary | ICD-10-CM | POA: Diagnosis not present

## 2016-10-05 DIAGNOSIS — I5032 Chronic diastolic (congestive) heart failure: Secondary | ICD-10-CM | POA: Diagnosis not present

## 2016-10-18 DIAGNOSIS — G4733 Obstructive sleep apnea (adult) (pediatric): Secondary | ICD-10-CM | POA: Diagnosis not present

## 2017-01-19 DIAGNOSIS — I10 Essential (primary) hypertension: Secondary | ICD-10-CM | POA: Diagnosis not present

## 2017-01-19 DIAGNOSIS — Z1211 Encounter for screening for malignant neoplasm of colon: Secondary | ICD-10-CM | POA: Diagnosis not present

## 2017-01-19 DIAGNOSIS — Z0001 Encounter for general adult medical examination with abnormal findings: Secondary | ICD-10-CM | POA: Diagnosis not present

## 2017-01-19 DIAGNOSIS — E782 Mixed hyperlipidemia: Secondary | ICD-10-CM | POA: Diagnosis not present

## 2017-02-13 DIAGNOSIS — I10 Essential (primary) hypertension: Secondary | ICD-10-CM | POA: Diagnosis not present

## 2017-02-13 DIAGNOSIS — I499 Cardiac arrhythmia, unspecified: Secondary | ICD-10-CM | POA: Diagnosis not present

## 2017-02-13 DIAGNOSIS — E782 Mixed hyperlipidemia: Secondary | ICD-10-CM | POA: Diagnosis not present

## 2017-02-13 DIAGNOSIS — Z0001 Encounter for general adult medical examination with abnormal findings: Secondary | ICD-10-CM | POA: Diagnosis not present

## 2017-04-06 ENCOUNTER — Other Ambulatory Visit: Payer: Self-pay | Admitting: Nurse Practitioner

## 2017-04-06 ENCOUNTER — Ambulatory Visit
Admission: RE | Admit: 2017-04-06 | Discharge: 2017-04-06 | Disposition: A | Discharge status: Home / Self Care | Payer: BLUE CROSS/BLUE SHIELD | Source: Ambulatory Visit | Attending: Nurse Practitioner | Admitting: Nurse Practitioner

## 2017-04-06 DIAGNOSIS — M25561 Pain in right knee: Secondary | ICD-10-CM | POA: Insufficient documentation

## 2017-04-06 DIAGNOSIS — M1711 Unilateral primary osteoarthritis, right knee: Secondary | ICD-10-CM | POA: Insufficient documentation

## 2017-04-06 DIAGNOSIS — M25461 Effusion, right knee: Secondary | ICD-10-CM | POA: Diagnosis not present

## 2017-04-16 DIAGNOSIS — G4733 Obstructive sleep apnea (adult) (pediatric): Secondary | ICD-10-CM | POA: Diagnosis not present

## 2017-04-26 DIAGNOSIS — G8929 Other chronic pain: Secondary | ICD-10-CM | POA: Diagnosis not present

## 2017-04-26 DIAGNOSIS — M25561 Pain in right knee: Secondary | ICD-10-CM | POA: Diagnosis not present

## 2017-05-14 DIAGNOSIS — M1711 Unilateral primary osteoarthritis, right knee: Secondary | ICD-10-CM | POA: Diagnosis not present

## 2017-05-21 DIAGNOSIS — E782 Mixed hyperlipidemia: Secondary | ICD-10-CM | POA: Diagnosis not present

## 2017-05-28 DIAGNOSIS — I10 Essential (primary) hypertension: Secondary | ICD-10-CM | POA: Diagnosis not present

## 2017-05-28 DIAGNOSIS — E782 Mixed hyperlipidemia: Secondary | ICD-10-CM | POA: Diagnosis not present

## 2017-05-28 DIAGNOSIS — M25561 Pain in right knee: Secondary | ICD-10-CM | POA: Diagnosis not present

## 2017-11-26 ENCOUNTER — Ambulatory Visit: Payer: BLUE CROSS/BLUE SHIELD | Admitting: Nurse Practitioner

## 2017-11-26 ENCOUNTER — Encounter: Payer: Self-pay | Admitting: Nurse Practitioner

## 2017-11-26 VITALS — BP 128/80 | HR 58 | Resp 16 | Ht 66.0 in | Wt 352.8 lb

## 2017-11-26 DIAGNOSIS — I5032 Chronic diastolic (congestive) heart failure: Secondary | ICD-10-CM | POA: Diagnosis not present

## 2017-11-26 DIAGNOSIS — G4730 Sleep apnea, unspecified: Secondary | ICD-10-CM | POA: Insufficient documentation

## 2017-11-26 DIAGNOSIS — I10 Essential (primary) hypertension: Secondary | ICD-10-CM | POA: Insufficient documentation

## 2017-11-26 DIAGNOSIS — J4520 Mild intermittent asthma, uncomplicated: Secondary | ICD-10-CM

## 2017-11-26 DIAGNOSIS — G4733 Obstructive sleep apnea (adult) (pediatric): Secondary | ICD-10-CM | POA: Diagnosis not present

## 2017-11-26 MED ORDER — ALBUTEROL SULFATE HFA 108 (90 BASE) MCG/ACT IN AERS: 2.0000 | INHALATION_SPRAY | Freq: Four times a day (QID) | RESPIRATORY_TRACT | 5 refills | Status: DC | PRN

## 2017-11-26 NOTE — Progress Notes (Addendum)
[facility] [address] [place], [place] [address]  Internal MEDICINE  Office Visit Note  Patient Name: Bradley Velez  [id number]  [id number]  Date of Service: 11/26/2017  Chief Complaint  Patient presents with  . Asthma    intermittent wheezing, mostly with exertion. needs new rescue inhaler.    Asthma  He complains of wheezing. There is no cough or shortness of breath. The current episode started more than 1 year ago. The problem occurs intermittently. The problem has been unchanged. Associated symptoms include dyspnea on exertion and rhinorrhea. Pertinent negatives include no chest pain, headaches, postnasal drip, sneezing or sore throat. His symptoms are aggravated by exercise. Relieved by: albuterol inhaler. He reports moderate improvement on treatment. His past medical history is significant for asthma.    Pt is here for routine follow up.    Current Medication: Outpatient Encounter Medications as of 11/26/2017  Medication Sig  . albuterol (PROVENTIL HFA) 108 (90 Base) MCG/ACT inhaler Inhale 2 puffs into the lungs every 6 (six) hours as needed for wheezing or shortness of breath.  . carvedilol (COREG) 6.25 MG tablet   . Elastic Bandages & Supports (TRUFORM STOCKINGS 20-30MMHG) MISC by Does not apply route.  . etodolac (LODINE) 400 MG tablet Take by mouth.  . furosemide (LASIX) 80 MG tablet   . losartan (COZAAR) 25 MG tablet Take 25 mg by mouth.  . Omega-3 Fatty Acids (FISH OIL OMEGA-3) 1000 MG CAPS Take by mouth. Take 2 tab every morning  . rosuvastatin (CRESTOR) 10 MG tablet   . [DISCONTINUED] albuterol (PROVENTIL HFA) 108 (90 Base) MCG/ACT inhaler Inhale into the lungs.   No facility-administered encounter medications on file as of 11/26/2017.     Surgical History: Past Surgical History:  Procedure Laterality Date  . EYE SURGERY Bilateral 1970    Medical History: Past Medical History:  Diagnosis Date  . Hyperlipidemia   . Hypertension   . Obesity   .  Sleep apnea     Family History: Family History  Problem Relation Age of Onset  . Emphysema Father   . Lung cancer Father     Social History   Socioeconomic History  . Marital status: Married    Spouse name: Not on file  . Number of children: Not on file  . Years of education: Not on file  . Highest education level: Not on file  Social Needs  . Financial resource strain: Not on file  . Food insecurity - worry: Not on file  . Food insecurity - inability: Not on file  . Transportation needs - medical: Not on file  . Transportation needs - non-medical: Not on file  Occupational History  . Not on file  Tobacco Use  . Smoking status: Never Smoker  . Smokeless tobacco: Never Used  Substance and Sexual Activity  . Alcohol use: Yes    Frequency: Never    Comment: social  . Drug use: No  . Sexual activity: Not on file  Other Topics Concern  . Not on file  Social History Narrative  . Not on file      Review of Systems  Constitutional: Negative for chills, fatigue and unexpected weight change.  HENT: Positive for rhinorrhea. Negative for congestion, postnasal drip, sneezing and sore throat.   Eyes: Negative for redness.  Respiratory: Positive for wheezing. Negative for cough, chest tightness and shortness of breath.        Intermittent and associated with exertion.   Cardiovascular: Positive for dyspnea on  exertion and leg swelling. Negative for chest pain and palpitations.       Chronic. Uses compression stockings regularly.  Gastrointestinal: Negative for abdominal distention, abdominal pain, constipation, diarrhea, nausea and vomiting.  Endocrine: Negative for cold intolerance, heat intolerance, polydipsia, polyphagia and polyuria.  Genitourinary: Negative for dysuria and frequency.  Musculoskeletal: Negative for arthralgias, back pain, joint swelling and neck pain.  Skin: Negative for rash.  Allergic/Immunologic: Positive for environmental allergies.  Neurological:  Negative for tremors, numbness and headaches.  Psychiatric/Behavioral: Negative for behavioral problems (Depression), sleep disturbance and suicidal ideas.    Today's Vitals   11/26/17 0941  BP: 128/80  Pulse: (!) 58  Resp: 16  SpO2: 94%  Weight: (!) 352 lb 12.8 oz (160 kg)  Height: 5\' 6"  (1.676 m)    Physical Exam  Constitutional: He is oriented to person, place, and time. He appears well-developed and well-nourished. No distress.  HENT:  Head: Normocephalic and atraumatic.  Mouth/Throat: Oropharynx is clear and moist. No oropharyngeal exudate.  Eyes: EOM are normal. Pupils are equal, round, and reactive to light.  Neck: Normal range of motion. Neck supple. No JVD present. Carotid bruit is not present. No tracheal deviation present. No thyromegaly present.  Cardiovascular: Normal rate. An irregular rhythm present. Exam reveals no gallop and no friction rub.  Murmur heard.  Systolic murmur is present with a grade of 2/6. Pulmonary/Chest: Effort normal. No respiratory distress. He has no wheezes. He has no rales. He exhibits no tenderness.  Abdominal: Soft. Bowel sounds are normal. There is no tenderness.  Musculoskeletal: Normal range of motion.  Lymphadenopathy:    He has no cervical adenopathy.  Neurological: He is alert and oriented to person, place, and time. No cranial nerve deficit.  Skin: Skin is warm and dry. He is not diaphoretic.  Psychiatric: He has a normal mood and affect. His behavior is normal. Judgment and thought content normal.  Nursing note and vitals reviewed.   Assessment/Plan:  1. Mild intermittent asthma in adult without complication - albuterol (PROVENTIL HFA) 108 (90 Base) MCG/ACT inhaler; Inhale 2 puffs into the lungs every 6 (six) hours as needed for wheezing or shortness of breath.  Dispense: 1 Inhaler; Refill: 5  2. Essential hypertension Stable. Continue bp medicatin as prescribed.   3. Congestive heart failure with left ventricular diastolic  dysfunction, chronic (HCC) - Ambulatory referral to Cardiology  4. Obstructive sleep apnea syndrome  continue regular visits with Dr. Freda Munro for CPAP management.   He should return to the office in 4 months for annual physical exam. He should also follow up as needed.   General Counseling: jearl soto understanding of the findings of todays visit and agrees with plan of treatment. I have discussed any further diagnostic evaluation that may be needed or ordered today. We also reviewed his medications today. he has been encouraged to call the office with any questions or concerns that should arise related to todays visit.    Orders Placed This Encounter  Procedures  . Ambulatory referral to Cardiology    Meds ordered this encounter  Medications  . albuterol (PROVENTIL HFA) 108 (90 Base) MCG/ACT inhaler    Sig: Inhale 2 puffs into the lungs every 6 (six) hours as needed for wheezing or shortness of breath.    Dispense:  1 Inhaler    Refill:  5    Order Specific Question:   Supervising Provider    Answer:   Lyndon Code [[id number]]    Time spent:  15  Minutes     Dr Lyndon CodeFozia M Khan Internal medicine

## 2018-02-21 ENCOUNTER — Other Ambulatory Visit: Payer: Self-pay

## 2018-02-21 MED ORDER — CARVEDILOL 6.25 MG PO TABS: ORAL_TABLET | ORAL | 1 refills | Status: DC

## 2018-03-25 ENCOUNTER — Encounter: Payer: Self-pay | Admitting: Nurse Practitioner

## 2018-03-29 ENCOUNTER — Other Ambulatory Visit: Payer: Self-pay

## 2018-03-29 MED ORDER — FUROSEMIDE 80 MG PO TABS
ORAL_TABLET | ORAL | 5 refills | Status: DC
Start: 2018-03-29 — End: 2018-08-16

## 2018-04-29 ENCOUNTER — Encounter (INDEPENDENT_AMBULATORY_CARE_PROVIDER_SITE_OTHER): Payer: Self-pay

## 2018-04-29 ENCOUNTER — Ambulatory Visit: Payer: BLUE CROSS/BLUE SHIELD | Admitting: Nurse Practitioner

## 2018-04-29 ENCOUNTER — Encounter: Payer: Self-pay | Admitting: Nurse Practitioner

## 2018-04-29 VITALS — BP 132/95 | HR 76 | Resp 16 | Ht 66.0 in | Wt 344.4 lb

## 2018-04-29 DIAGNOSIS — I10 Essential (primary) hypertension: Secondary | ICD-10-CM

## 2018-04-29 DIAGNOSIS — R300 Dysuria: Secondary | ICD-10-CM | POA: Diagnosis not present

## 2018-04-29 DIAGNOSIS — E782 Mixed hyperlipidemia: Secondary | ICD-10-CM

## 2018-04-29 DIAGNOSIS — Z0001 Encounter for general adult medical examination with abnormal findings: Secondary | ICD-10-CM | POA: Diagnosis not present

## 2018-04-29 DIAGNOSIS — Z125 Encounter for screening for malignant neoplasm of prostate: Secondary | ICD-10-CM | POA: Insufficient documentation

## 2018-04-29 DIAGNOSIS — L03119 Cellulitis of unspecified part of limb: Secondary | ICD-10-CM | POA: Insufficient documentation

## 2018-04-29 MED ORDER — CEPHALEXIN 500 MG PO CAPS: 500.0000 mg | ORAL_CAPSULE | Freq: Three times a day (TID) | ORAL | 3 refills | Status: DC

## 2018-04-29 NOTE — Progress Notes (Signed)
[facility] [address] [place], [place] [address]  Internal MEDICINE  Office Visit Note  Patient Name: Bradley Velez  [id number]  [id number]  Date of Service: 04/29/2018   Pt is here for routine health maintenance examination  Chief Complaint  Patient presents with  . Annual Exam  . Hypertension     Hypertension  This is a chronic problem. The current episode started more than 1 year ago. The problem is unchanged. The problem is controlled. Associated symptoms include malaise/fatigue, orthopnea and shortness of breath. Pertinent negatives include no chest pain, headaches, neck pain or palpitations. Risk factors for coronary artery disease include sedentary lifestyle, stress, obesity and male gender. Past treatments include angiotensin blockers and beta blockers. The current treatment provides moderate improvement. Compliance problems include exercise.  Hypertensive end-organ damage includes CAD/MI and left ventricular hypertrophy. Identifiable causes of hypertension include sleep apnea.     Current Medication: Outpatient Encounter Medications as of 04/29/2018  Medication Sig  . albuterol (PROVENTIL HFA) 108 (90 Base) MCG/ACT inhaler Inhale 2 puffs into the lungs every 6 (six) hours as needed for wheezing or shortness of breath.  . carvedilol (COREG) 6.25 MG tablet Take 1 tablet by mouth twice daily  . Elastic Bandages & Supports (TRUFORM STOCKINGS 20-30MMHG) MISC by Does not apply route.  . etodolac (LODINE) 400 MG tablet Take by mouth.  . furosemide (LASIX) 80 MG tablet Take 1 tab po daily  . losartan (COZAAR) 25 MG tablet Take 25 mg by mouth.  . Omega-3 Fatty Acids (FISH OIL OMEGA-3) 1000 MG CAPS Take by mouth. Take 2 tab every morning  . rosuvastatin (CRESTOR) 10 MG tablet   . cephALEXin (KEFLEX) 500 MG capsule Take 1 capsule (500 mg total) by mouth 3 (three) times daily.   No facility-administered encounter medications on file as of 04/29/2018.     Surgical  History: Past Surgical History:  Procedure Laterality Date  . EYE SURGERY Bilateral 1970    Medical History: Past Medical History:  Diagnosis Date  . Hyperlipidemia   . Hypertension   . Obesity   . Sleep apnea     Family History: Family History  Problem Relation Age of Onset  . Emphysema Father   . Lung cancer Father       Review of Systems  Constitutional: Positive for malaise/fatigue. Negative for activity change, chills, fatigue and unexpected weight change.  HENT: Negative for congestion, postnasal drip, rhinorrhea, sneezing, sore throat and voice change.   Eyes: Negative.  Negative for redness.  Respiratory: Positive for shortness of breath and wheezing. Negative for cough and chest tightness.        Intermittent and associated with exertion.   Cardiovascular: Positive for orthopnea and leg swelling. Negative for chest pain and palpitations.       Chronic. Uses compression stockings regularly.  Gastrointestinal: Negative for abdominal distention, abdominal pain, constipation, diarrhea, nausea and vomiting.  Endocrine: Negative for cold intolerance, heat intolerance, polydipsia, polyphagia and polyuria.  Genitourinary: Negative for dysuria and frequency.  Musculoskeletal: Negative for arthralgias, back pain, joint swelling and neck pain.  Skin: Negative for rash.  Allergic/Immunologic: Positive for environmental allergies.  Neurological: Negative for dizziness, tremors, numbness and headaches.  Hematological: Negative for adenopathy.  Psychiatric/Behavioral: Negative for behavioral problems (Depression), dysphoric mood, sleep disturbance and suicidal ideas.   Today's Vitals   04/29/18 0929  BP: (!) 132/95  Pulse: 76  Resp: 16  SpO2: 93%  Weight: (!) 344 lb 6.4 oz (156.2 kg)  Height:  5\' 6"  (1.676 m)      Physical Exam  Constitutional: He is oriented to person, place, and time. He appears well-developed and well-nourished. No distress.  HENT:  Head:  Normocephalic and atraumatic.  Nose: Nose normal.  Mouth/Throat: Oropharynx is clear and moist. No oropharyngeal exudate.  Eyes: Pupils are equal, round, and reactive to light. Conjunctivae and EOM are normal.  Neck: Normal range of motion. Neck supple. No JVD present. Carotid bruit is not present. No tracheal deviation present. No thyromegaly present.  Cardiovascular: Normal rate. An irregular rhythm present. Exam reveals no gallop and no friction rub.  Murmur heard.  Systolic murmur is present with a grade of 2/6. Mild/moderate, bilateral lower leg edema. Currently wearing compression socks.   Pulmonary/Chest: Effort normal and breath sounds normal. No respiratory distress. He has no wheezes. He has no rales. He exhibits no tenderness.  Abdominal: Soft. Bowel sounds are normal. There is no tenderness.  Musculoskeletal: Normal range of motion.  Lymphadenopathy:    He has no cervical adenopathy.  Neurological: He is alert and oriented to person, place, and time. No cranial nerve deficit.  Skin: Skin is warm and dry. Capillary refill takes 2 to 3 seconds. He is not diaphoretic.  Psychiatric: He has a normal mood and affect. His behavior is normal. Judgment and thought content normal.  Nursing note and vitals reviewed.  Assessment/Plan: 1. Encounter for general adult medical examination with abnormal findings Annual wellness visit today. Fasting labs ordered.  - CBC with Differential/Platelet - Comprehensive metabolic panel - TSH  2. Essential hypertension Stable. cotinue bp medication as prescribed  - CBC with Differential/Platelet - Comprehensive metabolic panel  3. Mixed hyperlipidemia Fasting lipid panel ordered. Adjust lipid lowering medication as indicated.  - Lipid panel - TSH  4. Cellulitis of lower extremity, unspecified laterality Chronic.  - cephALEXin (KEFLEX) 500 MG capsule; Take 1 capsule (500 mg total) by mouth 3 (three) times daily.  Dispense: 42 capsule; Refill:  3  5. Screening for prostate cancer - PSA  6. Dysuria - UA/M w/rflx Culture, Routine  General Counseling: senica crall understanding of the findings of todays visit and agrees with plan of treatment. I have discussed any further diagnostic evaluation that may be needed or ordered today. We also reviewed his medications today. he has been encouraged to call the office with any questions or concerns that should arise related to todays visit.    Counseling:  Hypertension Counseling:   The following hypertensive lifestyle modification were recommended and discussed:  1. Limiting alcohol intake to less than 1 oz/day of ethanol:(24 oz of beer or 8 oz of wine or 2 oz of 100-proof whiskey). 2. Take baby ASA 81 mg daily. 3. Importance of regular aerobic exercise and losing weight. 4. Reduce dietary saturated fat and cholesterol intake for overall cardiovascular health. 5. Maintaining adequate dietary potassium, calcium, and magnesium intake. 6. Regular monitoring of the blood pressure. 7. Reduce sodium intake to less than 100 mmol/day (less than 2.3 gm of sodium or less than 6 gm of sodium choride)   This patient was seen by Vincent Gros, FNP- C in Collaboration with Dr Lyndon Code as a part of collaborative care agreement  Orders Placed This Encounter  Procedures  . UA/M w/rflx Culture, Routine  . PSA  . CBC with Differential/Platelet  . Comprehensive metabolic panel  . Lipid panel  . TSH    Meds ordered this encounter  Medications  . cephALEXin (KEFLEX) 500 MG capsule  Sig: Take 1 capsule (500 mg total) by mouth 3 (three) times daily.    Dispense:  42 capsule    Refill:  3    Order Specific Question:   Supervising Provider    Answer:   Lyndon CodeKHAN, FOZIA M [[id number]]    Time spent: 1230 Minutes      Lyndon CodeFozia M Khan, MD  Internal Medicine

## 2018-04-30 LAB — SPECIMEN STATUS REPORT

## 2018-05-03 LAB — UA/M W/RFLX CULTURE, ROUTINE
Bilirubin, UA: NEGATIVE
Glucose, UA: NEGATIVE
Ketones, UA: NEGATIVE
Leukocytes, UA: NEGATIVE
Nitrite, UA: NEGATIVE
Protein, UA: NEGATIVE
RBC, UA: NEGATIVE
Specific Gravity, UA: 1.026 (ref 1.005–1.030)
Urobilinogen, Ur: 0.2 mg/dL (ref 0.2–1.0)
pH, UA: 5.5 (ref 5.0–7.5)

## 2018-05-03 LAB — MICROSCOPIC EXAMINATION
Casts: NONE SEEN /LPF
RBC, UA: NONE SEEN /HPF (ref 0–2)

## 2018-07-22 ENCOUNTER — Other Ambulatory Visit: Payer: Self-pay | Admitting: Nurse Practitioner

## 2018-07-22 MED ORDER — CARVEDILOL 6.25 MG PO TABS: ORAL_TABLET | ORAL | 1 refills | Status: DC

## 2018-08-07 ENCOUNTER — Other Ambulatory Visit: Payer: Self-pay

## 2018-08-07 MED ORDER — ROSUVASTATIN CALCIUM 10 MG PO TABS: 10.0000 mg | ORAL_TABLET | Freq: Every day | ORAL | 1 refills | Status: DC

## 2018-08-16 ENCOUNTER — Other Ambulatory Visit: Payer: Self-pay | Admitting: Nurse Practitioner

## 2018-10-28 ENCOUNTER — Inpatient Hospital Stay
Admission: EM | Admit: 2018-10-28 | Discharge: 2018-10-29 | DRG: 194 | Disposition: A | Discharge status: Home / Self Care | Payer: Self-pay | Attending: Internal Medicine | Admitting: Internal Medicine

## 2018-10-28 ENCOUNTER — Emergency Department: Payer: Self-pay

## 2018-10-28 ENCOUNTER — Emergency Department (HOSPITAL_COMMUNITY): Payer: Self-pay

## 2018-10-28 ENCOUNTER — Encounter: Payer: Self-pay | Admitting: Emergency Medicine

## 2018-10-28 ENCOUNTER — Other Ambulatory Visit: Payer: Self-pay

## 2018-10-28 DIAGNOSIS — Z801 Family history of malignant neoplasm of trachea, bronchus and lung: Secondary | ICD-10-CM

## 2018-10-28 DIAGNOSIS — J189 Pneumonia, unspecified organism: Secondary | ICD-10-CM | POA: Diagnosis present

## 2018-10-28 DIAGNOSIS — I110 Hypertensive heart disease with heart failure: Secondary | ICD-10-CM | POA: Diagnosis present

## 2018-10-28 DIAGNOSIS — J181 Lobar pneumonia, unspecified organism: Principal | ICD-10-CM | POA: Diagnosis present

## 2018-10-28 DIAGNOSIS — E876 Hypokalemia: Secondary | ICD-10-CM | POA: Diagnosis present

## 2018-10-28 DIAGNOSIS — Z79899 Other long term (current) drug therapy: Secondary | ICD-10-CM

## 2018-10-28 DIAGNOSIS — E782 Mixed hyperlipidemia: Secondary | ICD-10-CM | POA: Diagnosis present

## 2018-10-28 DIAGNOSIS — I890 Lymphedema, not elsewhere classified: Secondary | ICD-10-CM | POA: Diagnosis present

## 2018-10-28 DIAGNOSIS — G4733 Obstructive sleep apnea (adult) (pediatric): Secondary | ICD-10-CM | POA: Diagnosis present

## 2018-10-28 DIAGNOSIS — Z825 Family history of asthma and other chronic lower respiratory diseases: Secondary | ICD-10-CM

## 2018-10-28 DIAGNOSIS — E785 Hyperlipidemia, unspecified: Secondary | ICD-10-CM | POA: Diagnosis present

## 2018-10-28 DIAGNOSIS — I5022 Chronic systolic (congestive) heart failure: Secondary | ICD-10-CM | POA: Diagnosis present

## 2018-10-28 LAB — CBC
HEMATOCRIT: 41.7 % (ref 39.0–52.0)
HEMOGLOBIN: 13.9 g/dL (ref 13.0–17.0)
MCH: 31.9 pg (ref 26.0–34.0)
MCHC: 33.3 g/dL (ref 30.0–36.0)
MCV: 95.6 fL (ref 80.0–100.0)
NRBC: 0 % (ref 0.0–0.2)
PLATELETS: 206 10*3/uL (ref 150–400)
RBC: 4.36 MIL/uL (ref 4.22–5.81)
RDW: 12.6 % (ref 11.5–15.5)
WBC: 9.2 10*3/uL (ref 4.0–10.5)

## 2018-10-28 LAB — BASIC METABOLIC PANEL
Anion gap: 11 (ref 5–15)
BUN: 16 mg/dL (ref 6–20)
CHLORIDE: 106 mmol/L (ref 98–111)
CO2: 23 mmol/L (ref 22–32)
Calcium: 8.3 mg/dL — ABNORMAL LOW (ref 8.9–10.3)
Creatinine, Ser: 1.1 mg/dL (ref 0.61–1.24)
Glucose, Bld: 107 mg/dL — ABNORMAL HIGH (ref 70–99)
POTASSIUM: 3.1 mmol/L — AB (ref 3.5–5.1)
SODIUM: 140 mmol/L (ref 135–145)

## 2018-10-28 LAB — TROPONIN I

## 2018-10-28 MED ORDER — METHYLPREDNISOLONE SODIUM SUCC 125 MG IJ SOLR
125.0000 mg | Freq: Once | INTRAMUSCULAR | Status: AC
  Administered 2018-10-28: 125 mg via INTRAVENOUS

## 2018-10-28 MED ORDER — SODIUM CHLORIDE 0.9 % IV SOLN
500.0000 mg | INTRAVENOUS | Status: DC
  Filled 2018-10-28: qty 500

## 2018-10-28 MED ORDER — LOSARTAN POTASSIUM 25 MG PO TABS
25.0000 mg | ORAL_TABLET | Freq: Every day | ORAL | Status: DC
  Administered 2018-10-28 – 2018-10-29 (×2): 25 mg via ORAL
  Filled 2018-10-28 (×2): qty 1

## 2018-10-28 MED ORDER — IPRATROPIUM-ALBUTEROL 0.5-2.5 (3) MG/3ML IN SOLN
3.0000 mL | RESPIRATORY_TRACT | Status: DC
  Administered 2018-10-29 (×5): 3 mL via RESPIRATORY_TRACT
  Filled 2018-10-28 (×5): qty 3

## 2018-10-28 MED ORDER — METHYLPREDNISOLONE SODIUM SUCC 125 MG IJ SOLR
60.0000 mg | Freq: Two times a day (BID) | INTRAMUSCULAR | Status: DC
  Administered 2018-10-29: 60 mg via INTRAVENOUS
  Filled 2018-10-28: qty 2

## 2018-10-28 MED ORDER — SODIUM CHLORIDE 0.9 % IV SOLN
INTRAVENOUS | Status: DC | PRN
  Administered 2018-10-28: 250 mL via INTRAVENOUS
  Administered 2018-10-29: 10 mL/h via INTRAVENOUS

## 2018-10-28 MED ORDER — ENOXAPARIN SODIUM 40 MG/0.4ML ~~LOC~~ SOLN
40.0000 mg | Freq: Two times a day (BID) | SUBCUTANEOUS | Status: DC
  Administered 2018-10-28 – 2018-10-29 (×2): 40 mg via SUBCUTANEOUS
  Filled 2018-10-28 (×2): qty 0.4

## 2018-10-28 MED ORDER — INFLUENZA VAC SPLIT QUAD 0.5 ML IM SUSY: 0.5000 mL | PREFILLED_SYRINGE | INTRAMUSCULAR | Status: DC

## 2018-10-28 MED ORDER — SODIUM CHLORIDE 0.9 % IV SOLN
1.0000 g | Freq: Once | INTRAVENOUS | Status: AC
  Administered 2018-10-28: 1 g via INTRAVENOUS
  Filled 2018-10-28: qty 1

## 2018-10-28 MED ORDER — METHYLPREDNISOLONE SODIUM SUCC 125 MG IJ SOLR
INTRAMUSCULAR | Status: AC
  Filled 2018-10-28: qty 2

## 2018-10-28 MED ORDER — ACETAMINOPHEN 650 MG RE SUPP: 650.0000 mg | Freq: Four times a day (QID) | RECTAL | Status: DC | PRN

## 2018-10-28 MED ORDER — HYDROCODONE-ACETAMINOPHEN 5-325 MG PO TABS: 1.0000 | ORAL_TABLET | ORAL | Status: DC | PRN

## 2018-10-28 MED ORDER — SODIUM CHLORIDE 0.9 % IV SOLN
500.0000 mg | Freq: Once | INTRAVENOUS | Status: AC
  Administered 2018-10-28: 500 mg via INTRAVENOUS
  Filled 2018-10-28: qty 500

## 2018-10-28 MED ORDER — CARVEDILOL 6.25 MG PO TABS
6.2500 mg | ORAL_TABLET | Freq: Two times a day (BID) | ORAL | Status: DC
  Administered 2018-10-29: 6.25 mg via ORAL
  Filled 2018-10-28: qty 1

## 2018-10-28 MED ORDER — IPRATROPIUM-ALBUTEROL 0.5-2.5 (3) MG/3ML IN SOLN
3.0000 mL | Freq: Once | RESPIRATORY_TRACT | Status: AC
  Administered 2018-10-28: 3 mL via RESPIRATORY_TRACT
  Filled 2018-10-28: qty 3

## 2018-10-28 MED ORDER — BISACODYL 5 MG PO TBEC: 5.0000 mg | DELAYED_RELEASE_TABLET | Freq: Every day | ORAL | Status: DC | PRN

## 2018-10-28 MED ORDER — ACETAMINOPHEN 325 MG PO TABS: 650.0000 mg | ORAL_TABLET | Freq: Four times a day (QID) | ORAL | Status: DC | PRN

## 2018-10-28 MED ORDER — SODIUM CHLORIDE 0.9 % IV SOLN
1.0000 g | INTRAVENOUS | Status: DC
  Administered 2018-10-29: 1 g via INTRAVENOUS
  Filled 2018-10-28: qty 10
  Filled 2018-10-28: qty 1
  Filled 2018-10-28: qty 10

## 2018-10-28 MED ORDER — POTASSIUM CHLORIDE 10 MEQ/100ML IV SOLN
10.0000 meq | INTRAVENOUS | Status: AC
  Administered 2018-10-28 – 2018-10-29 (×4): 10 meq via INTRAVENOUS
  Filled 2018-10-28 (×8): qty 100

## 2018-10-28 MED ORDER — FUROSEMIDE 80 MG PO TABS: 80.0000 mg | ORAL_TABLET | Freq: Every day | ORAL | Status: DC

## 2018-10-28 MED ORDER — DOCUSATE SODIUM 100 MG PO CAPS
100.0000 mg | ORAL_CAPSULE | Freq: Two times a day (BID) | ORAL | Status: DC
  Administered 2018-10-28 – 2018-10-29 (×2): 100 mg via ORAL
  Filled 2018-10-28 (×2): qty 1

## 2018-10-28 MED ORDER — ROSUVASTATIN CALCIUM 10 MG PO TABS
10.0000 mg | ORAL_TABLET | Freq: Every day | ORAL | Status: DC
  Administered 2018-10-28 – 2018-10-29 (×2): 10 mg via ORAL
  Filled 2018-10-28 (×2): qty 1

## 2018-10-28 MED ORDER — FUROSEMIDE 10 MG/ML IJ SOLN
40.0000 mg | Freq: Two times a day (BID) | INTRAMUSCULAR | Status: DC
  Administered 2018-10-29: 40 mg via INTRAVENOUS
  Filled 2018-10-28: qty 4

## 2018-10-28 MED ORDER — ONDANSETRON HCL 4 MG PO TABS: 4.0000 mg | ORAL_TABLET | Freq: Four times a day (QID) | ORAL | Status: DC | PRN

## 2018-10-28 MED ORDER — ONDANSETRON HCL 4 MG/2ML IJ SOLN: 4.0000 mg | Freq: Four times a day (QID) | INTRAMUSCULAR | Status: DC | PRN

## 2018-10-28 NOTE — ED Provider Notes (Signed)
[facility] Emergency Department Provider Note   ____________________________________________    I have reviewed the triage vital signs and the nursing notes.   HISTORY  Chief Complaint Chest Pain and Shortness of Breath     HPI Bradley Velez is a 53 y.o. male who presents with complaints of chest tightness, shortness of breath, cough and fevers.  Patient reports he was seen 3 days ago at urgent care diagnosed with bronchitis and started on cefdinir.  He reports since then he has worsened with more shortness of breath, more fatigue, wheezing.  He reports he has been using albuterol inhaler with little improvement.  No recent travel.  No calf pain or swelling.   Past Medical History:  Diagnosis Date  . Hyperlipidemia   . Hypertension   . Obesity   . Sleep apnea     Patient Active Problem List   Diagnosis Date Noted  . Screening for prostate cancer 04/29/2018  . Mixed hyperlipidemia 04/29/2018  . Cellulitis of lower extremity 04/29/2018  . Dysuria 04/29/2018  . Hypertension 11/26/2017  . Sleep apnea 11/26/2017    Past Surgical History:  Procedure Laterality Date  . EYE SURGERY Bilateral 1970    Prior to Admission medications   Medication Sig Start Date End Date Taking? Authorizing Provider  albuterol (PROVENTIL HFA) 108 (90 Base) MCG/ACT inhaler Inhale 2 puffs into the lungs every 6 (six) hours as needed for wheezing or shortness of breath. 11/26/17   Carlean JewsBoscia, Heather E, NP  carvedilol (COREG) 6.25 MG tablet Take 1 tablet by mouth twice daily 07/22/18   Carlean JewsBoscia, Heather E, NP  cephALEXin (KEFLEX) 500 MG capsule Take 1 capsule (500 mg total) by mouth 3 (three) times daily. 04/29/18   Carlean JewsBoscia, Heather E, NP  Elastic Bandages & Supports (TRUFORM STOCKINGS 20-30MMHG) MISC by Does not apply route.    [provider]  etodolac (LODINE) 400 MG tablet Take by mouth.    [provider]  furosemide (LASIX) 80 MG tablet TAKE 1 TABLET BY  MOUTH ONCE DAILY 08/16/18   Carlean JewsBoscia, Heather E, NP  losartan (COZAAR) 25 MG tablet Take 25 mg by mouth.    [provider]  Omega-3 Fatty Acids (FISH OIL OMEGA-3) 1000 MG CAPS Take by mouth. Take 2 tab every morning    [provider]  rosuvastatin (CRESTOR) 10 MG tablet Take 1 tablet (10 mg total) by mouth daily. 08/07/18   Carlean JewsBoscia, Heather E, NP     Allergies Patient has no known allergies.  Family History  Problem Relation Age of Onset  . Emphysema Father   . Lung cancer Father     Social History Social History   Tobacco Use  . Smoking status: Never Smoker  . Smokeless tobacco: Never Used  Substance Use Topics  . Alcohol use: Yes    Frequency: Never    Comment: social  . Drug use: No    Review of Systems  Constitutional: As above Eyes: No visual changes.  ENT: No sore throat. Cardiovascular: As above. Respiratory: As above Gastrointestinal: No abdominal pain.  No nausea, no vomiting.   Genitourinary: Negative for dysuria. Musculoskeletal: Negative for back pain. Skin: Negative for rash. Neurological: Negative for headaches or weakness   ____________________________________________   PHYSICAL EXAM:  VITAL SIGNS: ED Triage Vitals  Enc Vitals Group     BP 10/28/18 1556 (!) 144/116     Pulse Rate 10/28/18 1556 94     Resp 10/28/18 1556 20  Temp 10/28/18 1556 98.2 F (36.8 C)     Temp Source 10/28/18 1556 Oral     SpO2 10/28/18 1556 97 %     Weight 10/28/18 1555 (!) 148.8 kg (328 lb)     Height 10/28/18 1555 1.676 m (5\' 6" )     Head Circumference --      Peak Flow --      Pain Score 10/28/18 1555 6     Pain Loc --      Pain Edu? --      Excl. in GC? --     Constitutional: Alert and oriented.  Eyes: Conjunctivae are normal.   Nose: No congestion/rhinnorhea. Mouth/Throat: Mucous membranes are moist.    Cardiovascular: Normal rate, regular rhythm. Grossly normal heart sounds.  Good peripheral circulation. Respiratory: Increased  respiratory effort with tachypnea, no retractions, scattered wheezes. Gastrointestinal: Soft and nontender. No distention.  No CVA tenderness.  Musculoskeletal: No lower extremity tenderness nor edema.  Warm and well perfused Neurologic:  Normal speech and language. No gross focal neurologic deficits are appreciated.  Skin:  Skin is warm, dry and intact. No rash noted. Psychiatric: Mood and affect are normal. Speech and behavior are normal.  ____________________________________________   LABS (all labs ordered are listed, but only abnormal results are displayed)  Labs Reviewed  BASIC METABOLIC PANEL - Abnormal; Notable for the following components:      Result Value   Potassium 3.1 (*)    Glucose, Bld 107 (*)    Calcium 8.3 (*)    All other components within normal limits  CULTURE, BLOOD (ROUTINE X 2)  CULTURE, BLOOD (ROUTINE X 2)  CBC  TROPONIN I   ____________________________________________  EKG  ED ECG REPORT I, Jene Everyobert Nyair Depaulo, the attending physician, personally viewed and interpreted this ECG.  Date: 10/28/2018  Rhythm: normal sinus rhythm QRS Axis: normal Intervals: normal ST/T Wave abnormalities: normal Narrative Interpretation: no evidence of acute ischemia  ____________________________________________  RADIOLOGY  Chest x-ray demonstrates upper lobe nodular opacities CT Noncon of the chest demonstrates right upper lobe and middle lobe pneumonia ____________________________________________   PROCEDURES  Procedure(s) performed: No  Procedures   Critical Care performed: No ____________________________________________   INITIAL IMPRESSION / ASSESSMENT AND PLAN / ED COURSE  Pertinent labs & imaging results that were available during my care of the patient were reviewed by me and considered in my medical decision making (see chart for details).  Presents with shortness of breath, wheezing, treated with IV Solu-Medrol, duo nebs.  Chest x-ray  significant for nodular opacities, will obtain CT.  CT demonstrates multi lobar pneumonia, patient has failed outpatient treatment.  Will start IV Rocephin, IV azithromycin admit to the hospital service    ____________________________________________   FINAL CLINICAL IMPRESSION(S) / ED DIAGNOSES  Final diagnoses:  Community acquired pneumonia of right middle lobe of lung (HCC)        Note:  This document was prepared using Dragon voice recognition software and may include unintentional dictation errors.   Jene EveryKinner, Verdie Barrows, MD 10/28/18 [phone]

## 2018-10-28 NOTE — ED Triage Notes (Signed)
Pt reports Friday started with sharp chest pain to the center of his chest and some SOB. Pt reports wheezing a lot also. Pt reports was seen at [facility] and given inhalers and abx for resp infection but reports sx's are not better.

## 2018-10-28 NOTE — ED Notes (Signed)
Pt given tray and apple juice

## 2018-10-28 NOTE — ED Notes (Signed)
Pharmacy to send up medications currently out of stock in pyxis

## 2018-10-28 NOTE — H&P (Signed)
[facility] [place] at [facility]   PATIENT NAME: Bradley Velez    MR#:  [id number]  DATE OF BIRTH:  1966/10/11  DATE OF ADMISSION:  10/28/2018  PRIMARY CARE PHYSICIAN: Carlean JewsBoscia, Heather E, NP   REQUESTING/REFERRING PHYSICIAN: Dr. Trenton FoundsKiner  CHIEF COMPLAINT: Shortness of breath   Chief Complaint  Patient presents with  . Chest Pain  . Shortness of Breath    HISTORY OF PRESENT ILLNESS:  Bradley Axnthony States  is a 53 y.o. male with a known history of edema of the legs, hypertension, sleep apnea brought in because of worsening shortness of breath, cough, wheezing.  Patient reports 3 days ago heard wheezing, low-grade fever went to urgent care and was given Omnicef for bronchitis.  Patient has inhalers at home.  Continue to have worsening shortness of breath, dry cough.  So she he came to hospital because of worsening symptoms.  CT chest showed multilobar pneumonia.  PAST MEDICAL HISTORY:   Past Medical History:  Diagnosis Date  . Hyperlipidemia   . Hypertension   . Obesity   . Sleep apnea     PAST SURGICAL HISTOIRY:   Past Surgical History:  Procedure Laterality Date  . EYE SURGERY Bilateral 1970    SOCIAL HISTORY:   Social History   Tobacco Use  . Smoking status: Never Smoker  . Smokeless tobacco: Never Used  Substance Use Topics  . Alcohol use: Yes    Frequency: Never    Comment: social    FAMILY HISTORY:   Family History  Problem Relation Age of Onset  . Emphysema Father   . Lung cancer Father     DRUG ALLERGIES:  No Known Allergies  REVIEW OF SYSTEMS:  CONSTITUTIONAL: No fever, fatigue or weakness.  EYES: No blurred or double vision.  EARS, NOSE, AND THROAT: No tinnitus or ear pain.  RESPIRATORY: Cough, shortness of breath, wheezing CARDIOVASCULAR: No chest pain, orthopnea, edema.  GASTROINTESTINAL: No nausea, vomiting, diarrhea or abdominal pain.  GENITOURINARY: No dysuria, hematuria.  ENDOCRINE: No polyuria, nocturia,   HEMATOLOGY: No anemia, easy bruising or bleeding SKIN: No rash or lesion. MUSCULOSKELETAL: No joint pain or arthritis.   NEUROLOGIC: No tingling, numbness, weakness.  PSYCHIATRY: No anxiety or depression.   MEDICATIONS AT HOME:   Prior to Admission medications   Medication Sig Start Date End Date Taking? Authorizing Provider  albuterol (PROVENTIL HFA) 108 (90 Base) MCG/ACT inhaler Inhale 2 puffs into the lungs every 6 (six) hours as needed for wheezing or shortness of breath. 11/26/17   Carlean JewsBoscia, Heather E, NP  carvedilol (COREG) 6.25 MG tablet Take 1 tablet by mouth twice daily 07/22/18   Carlean JewsBoscia, Heather E, NP  cephALEXin (KEFLEX) 500 MG capsule Take 1 capsule (500 mg total) by mouth 3 (three) times daily. 04/29/18   Carlean JewsBoscia, Heather E, NP  Elastic Bandages & Supports (TRUFORM STOCKINGS 20-30MMHG) MISC by Does not apply route.    [provider]  etodolac (LODINE) 400 MG tablet Take by mouth.    [provider]  furosemide (LASIX) 80 MG tablet TAKE 1 TABLET BY MOUTH ONCE DAILY 08/16/18   Carlean JewsBoscia, Heather E, NP  losartan (COZAAR) 25 MG tablet Take 25 mg by mouth.    [provider]  Omega-3 Fatty Acids (FISH OIL OMEGA-3) 1000 MG CAPS Take by mouth. Take 2 tab every morning    [provider]  rosuvastatin (CRESTOR) 10 MG tablet Take 1 tablet (10 mg total) by mouth daily. 08/07/18   Vincent GrosBoscia, Heather  E, NP      VITAL SIGNS:  Blood pressure (!) 144/116, pulse 93, temperature 98.2 F (36.8 C), temperature source Oral, resp. rate 18, height 5\' 6"  (1.676 m), weight (!) 148.8 kg, SpO2 97 %.  PHYSICAL EXAMINATION:  GENERAL:  53 y.o.-year-old patient lying in the bed with no acute distress.  Morbidly obese with EYES: Pupils equal, round, reactive to light and accommodation. No scleral icterus. Extraocular muscles intact.  HEENT: Head atraumatic, normocephalic. Oropharynx and nasopharynx clear.  NECK:  Supple, no jugular venous distention. No thyroid enlargement, no  tenderness.  LUNGS:  expiratory wheeze in all lung fields  cARDIOVASCULAR: S1, S2 normal. No murmurs, rubs, or gallops.  ABDOMEN: Soft, nontender, nondistended. Bowel sounds present. No organomegaly or mass.  EXTREMITIES: No pedal edema, cyanosis, or clubbing.  NEUROLOGIC: Cranial nerves II through XII are intact. Muscle strength 5/5 in all extremities. Sensation intact. Gait not checked.  PSYCHIATRIC: The patient is alert and oriented x 3.  SKIN: No obvious rash, lesion, or ulcer.   LABORATORY PANEL:   CBC Recent Labs  Lab 10/28/18 1601  WBC 9.2  HGB 13.9  HCT 41.7  PLT 206   ------------------------------------------------------------------------------------------------------------------  Chemistries  Recent Labs  Lab 10/28/18 1601  NA 140  K 3.1*  CL 106  CO2 23  GLUCOSE 107*  BUN 16  CREATININE 1.10  CALCIUM 8.3*   ------------------------------------------------------------------------------------------------------------------  Cardiac Enzymes Recent Labs  Lab 10/28/18 1601  TROPONINI <0.03   ------------------------------------------------------------------------------------------------------------------  RADIOLOGY:  Dg Chest 2 View  Result Date: 10/28/2018 CLINICAL DATA:  Chest pain and shortness of breath.  Wheezing. EXAM: CHEST - 2 VIEW COMPARISON:  March 25, 2014 FINDINGS: There is a nodular opacity in the right upper lobe measuring 6 mm, not present previously. There is a 4 mm nodular opacity in the left upper lobe, also not appreciable previously. There is no edema or consolidation. Heart is upper normal in size with pulmonary vascularity normal. There is prominent epicardial fat anteriorly. No adenopathy. No bone lesions. IMPRESSION: Upper lobe nodular opacities, largest measuring 6 mm. Given change from previous study, noncontrast chest CT advised to further evaluate. No frank edema or consolidation. Stable cardiac silhouette. Electronically Signed   By:  Bretta BangWilliam  Woodruff III M.D.   On: 10/28/2018 16:22   Ct Chest Wo Contrast  Result Date: 10/28/2018 CLINICAL DATA:  Sharp central chest pain and shortness of breath EXAM: CT CHEST WITHOUT CONTRAST TECHNIQUE: Multidetector CT imaging of the chest was performed following the standard protocol without IV contrast. COMPARISON:  10/28/2017 chest radiograph FINDINGS: Cardiovascular: No significant vascular findings. Normal heart size. No pericardial effusion. Mediastinum/Nodes: 13 mm right pretracheal lymph node. No axillary adenopathy. Normal course of the esophagus. Visualized thyroid gland is normal. Lungs/Pleura: There is consolidation in the right upper lobe with numerous adjacent nodular opacities. Smaller focus of consolidation also within the superior right middle lobe. No pleural effusion. There are calcified granulomata in the right lower lobe. Upper Abdomen: No acute abnormality. Musculoskeletal: IMPRESSION: 1. Right upper lobe and middle lobe pneumonia. Followup PA and lateral chest X-ray is recommended in 3-4 weeks following trial of antibiotic therapy to ensure resolution and exclude underlying malignancy. 2. Mildly enlarged right pretracheal lymph node, likely reactive. Electronically Signed   By: Deatra RobinsonKevin  Herman M.D.   On: 10/28/2018 19:16    EKG:   Orders placed or performed during the hospital encounter of 10/28/18  . EKG 12-Lead  . EKG 12-Lead    IMPRESSION AND PLAN:  53 year old  morbidly obese male with hypertension recently treated with antibiotics at bronchitis comes in with worsening shortness of breath, cough, wheezing now CT chest showed multilobar pneumonia on the right side.  #1 shortness of breath, cough secondary to multilobar pneumonia, patient is not hypoxic but failed outpatient therapy with antibiotics, still has wheezing, admit the patient to medical floor, start IV steroids, bronchodilators, IV antibiotics and see how he does.  Patient needs repeat chest x-ray in 3 to 4 weeks  for follow-up of resolution . 2.  Hypokalemia ; replace the potassium #3. hypertension: Controlled. 4.  Chronic systolic heart failure, patient had echo 4 years ago which showed EF 25 to 30%.  Repeat echocardiogram while he is here, start IV Lasix.  All the records are reviewed and case discussed with ED provider. Management plans discussed with the patient, family and they are in agreement.  CODE STATUS: Full code  TOTAL TIME TAKING CARE OF THIS PATIENT: 55 minutes.    Katha Hamming M.D on 10/28/2018 at 8:24 PM  Between 7am to 6pm - Pager - [phone]  After 6pm go to [email or web address] - password [email or web address]  [facility]  Office  [phone]  CC: Primary care physician; Carlean Jews, NP  Note: This dictation was prepared with Dragon dictation along with smaller phrase technology. Any transcriptional errors that result from this process are unintentional.

## 2018-10-29 LAB — BASIC METABOLIC PANEL
Anion gap: 8 (ref 5–15)
BUN: 16 mg/dL (ref 6–20)
CO2: 25 mmol/L (ref 22–32)
Calcium: 8.5 mg/dL — ABNORMAL LOW (ref 8.9–10.3)
Chloride: 106 mmol/L (ref 98–111)
Creatinine, Ser: 0.76 mg/dL (ref 0.61–1.24)
GFR calc Af Amer: >60 mL/min (ref >60)
Glucose, Bld: 182 mg/dL — ABNORMAL HIGH (ref 70–99)
Potassium: 3.7 mmol/L (ref 3.5–5.1)
Sodium: 139 mmol/L (ref 135–145)

## 2018-10-29 LAB — CBC
HCT: 40.9 % (ref 39.0–52.0)
Hemoglobin: 13.5 g/dL (ref 13.0–17.0)
MCH: 32.3 pg (ref 26.0–34.0)
MCHC: 33 g/dL (ref 30.0–36.0)
MCV: 97.8 fL (ref 80.0–100.0)
Platelets: 226 10*3/uL (ref 150–400)
RBC: 4.18 MIL/uL — ABNORMAL LOW (ref 4.22–5.81)
RDW: 12.7 % (ref 11.5–15.5)
WBC: 6.8 10*3/uL (ref 4.0–10.5)
nRBC: 0 % (ref 0.0–0.2)

## 2018-10-29 LAB — GLUCOSE, CAPILLARY: Glucose-Capillary: 155 mg/dL — ABNORMAL HIGH (ref 70–99)

## 2018-10-29 MED ORDER — CEFDINIR 300 MG PO CAPS: 300.0000 mg | ORAL_CAPSULE | Freq: Two times a day (BID) | ORAL | 0 refills | Status: AC

## 2018-10-29 MED ORDER — PREDNISONE 20 MG PO TABS: 40.0000 mg | ORAL_TABLET | Freq: Every day | ORAL | 0 refills | Status: AC

## 2018-10-29 NOTE — Care Management Note (Signed)
Case Management Note  Patient Details  Name: Bradley Velez MRN: [id number] Date of Birth: 10-21-1966   Patient was discharged home today.  Patient lost his health insurance at the beginning of the year.  Patient provided information on [facility]  And Medication Management .  Patient request for coupons from goodrx to be printed for [employer].  Omnicef $13.04 and prednisone $7.87 out of pocket. Patient confirms he will be able to purchase both medications at discharge.   Subjective/Objective:                    Action/Plan:   Expected Discharge Date:  10/29/18               Expected Discharge Plan:  Home/Self Care  In-House Referral:     Discharge planning Services  CM Consult, Indigent Health Clinic, Medication Assistance  Post Acute Care Choice:    Choice offered to:     DME Arranged:    DME Agency:     HH Arranged:    HH Agency:     Status of Service:  Completed, signed off  If discussed at [employer] Meetings, dates discussed:    Additional Comments:  Chapman FitchBOWEN, Sascha Palma T, RN 10/29/2018, 5:04 PM

## 2018-10-29 NOTE — Discharge Summary (Signed)
[facility] - [place] at [facility]   PATIENT NAME: Bradley Velez    MR#:  [id number]  DATE OF BIRTH:  Mar 13, 1966  DATE OF ADMISSION:  10/28/2018 ADMITTING PHYSICIAN: Katha Hamming, MD  DATE OF DISCHARGE: October 29, 2018  PRIMARY CARE PHYSICIAN: Carlean Jews, NP    ADMISSION DIAGNOSIS:  Community acquired pneumonia of right middle lobe of lung (HCC) [J18.1]  DISCHARGE DIAGNOSIS:  Active Problems:   Pneumonia   SECONDARY DIAGNOSIS:   Past Medical History:  Diagnosis Date  . Hyperlipidemia   . Hypertension   . Obesity   . Sleep apnea     HOSPITAL COURSE:  53 year old morbidly obese male with chronic lymphedema and sleep apnea and remote history of chronic systolic heart failure who presents with shortness of breath and cough.  1.  Multilobar pneumonia as etiology of patient's shortness of breath: Patient symptoms have improved He will continue antibiotics.  He needs follow-up chest x-ray in 3 to 4 weeks.  2.  Hypokalemia: This was repleted  3.  OSA: Continue CPAP  4.  History of chronic systolic heart failure ejection fraction 40% in 2016: Patient did not want repeat echocardiogram.  5.  Essential hypertension: Continue losartan and Coreg  6.  Chronic lymphedema: Patient wears compression hose     DISCHARGE CONDITIONS AND DIET:   Stable heart healthy diet  CONSULTS OBTAINED:    DRUG ALLERGIES:  No Known Allergies  DISCHARGE MEDICATIONS:   Allergies as of 10/29/2018   No Known Allergies     Medication List    STOP taking these medications   cephALEXin 500 MG capsule Commonly known as:  KEFLEX     TAKE these medications   albuterol 108 (90 Base) MCG/ACT inhaler Commonly known as:  PROVENTIL HFA Inhale 2 puffs into the lungs every 6 (six) hours as needed for wheezing or shortness of breath.   carvedilol 6.25 MG tablet Commonly known as:  COREG Take 1 tablet by mouth twice daily   cefdinir 300 MG capsule Commonly  known as:  OMNICEF Take 1 capsule (300 mg total) by mouth 2 (two) times daily for 4 days.   etodolac 400 MG tablet Commonly known as:  LODINE Take by mouth.   FISH OIL OMEGA-3 1000 MG Caps Take by mouth. Take 2 tab every morning   furosemide 80 MG tablet Commonly known as:  LASIX TAKE 1 TABLET BY MOUTH ONCE DAILY   losartan 25 MG tablet Commonly known as:  COZAAR Take 25 mg by mouth.   predniSONE 20 MG tablet Commonly known as:  DELTASONE Take 2 tablets (40 mg total) by mouth daily with breakfast for 4 days.   rosuvastatin 10 MG tablet Commonly known as:  CRESTOR Take 1 tablet (10 mg total) by mouth daily.   TRUFORM STOCKINGS 20-30MMHG Misc by Does not apply route.         Today   CHIEF COMPLAINT:  Feeling better this am   VITAL SIGNS:  Blood pressure 117/83, pulse 67, temperature 98 F (36.7 C), temperature source Oral, resp. rate 20, height 5\' 6"  (1.676 m), weight (!) 151.5 kg, SpO2 93 %.   REVIEW OF SYSTEMS:  Review of Systems  Constitutional: Negative.  Negative for chills, fever and malaise/fatigue.  HENT: Negative.  Negative for ear discharge, ear pain, hearing loss, nosebleeds and sore throat.   Eyes: Negative.  Negative for blurred vision and pain.  Respiratory: Positive for cough. Negative for hemoptysis, shortness of breath and wheezing.  Cardiovascular: Positive for leg swelling. Negative for chest pain and palpitations.  Gastrointestinal: Negative.  Negative for abdominal pain, blood in stool, diarrhea, nausea and vomiting.  Genitourinary: Negative.  Negative for dysuria.  Musculoskeletal: Negative.  Negative for back pain.  Skin: Negative.   Neurological: Negative for dizziness, tremors, speech change, focal weakness, seizures and headaches.  Endo/Heme/Allergies: Negative.  Does not bruise/bleed easily.  Psychiatric/Behavioral: Negative.  Negative for depression, hallucinations and suicidal ideas.     PHYSICAL EXAMINATION:  GENERAL:  53  y.o.-year-old patient lying in the bed with no acute distress.  Obese NECK:  Supple, no jugular venous distention. No thyroid enlargement, no tenderness.  LUNGS: Wheezing right middle lobe normal respiratory effort CARDIOVASCULAR: S1, S2 normal. No murmurs, rubs, or gallops.  ABDOMEN: Soft, non-tender, non-distended. Bowel sounds present. No organomegaly or mass.  EXTREMITIES: 3+ chronic edema no cyanosis, or clubbing.  PSYCHIATRIC: The patient is alert and oriented x 3.  SKIN: No obvious rash, lesion, or ulcer.   DATA REVIEW:   CBC Recent Labs  Lab 10/29/18 0422  WBC 6.8  HGB 13.5  HCT 40.9  PLT 226    Chemistries  Recent Labs  Lab 10/29/18 0422  NA 139  K 3.7  CL 106  CO2 25  GLUCOSE 182*  BUN 16  CREATININE 0.76  CALCIUM 8.5*    Cardiac Enzymes Recent Labs  Lab 10/28/18 1601  TROPONINI <0.03    Microbiology Results  @[email or web address] @  RADIOLOGY:  Dg Chest 2 View  Result Date: 10/28/2018 CLINICAL DATA:  Chest pain and shortness of breath.  Wheezing. EXAM: CHEST - 2 VIEW COMPARISON:  March 25, 2014 FINDINGS: There is a nodular opacity in the right upper lobe measuring 6 mm, not present previously. There is a 4 mm nodular opacity in the left upper lobe, also not appreciable previously. There is no edema or consolidation. Heart is upper normal in size with pulmonary vascularity normal. There is prominent epicardial fat anteriorly. No adenopathy. No bone lesions. IMPRESSION: Upper lobe nodular opacities, largest measuring 6 mm. Given change from previous study, noncontrast chest CT advised to further evaluate. No frank edema or consolidation. Stable cardiac silhouette. Electronically Signed   By: Bretta BangWilliam  Woodruff III M.D.   On: 10/28/2018 16:22   Ct Chest Wo Contrast  Result Date: 10/28/2018 CLINICAL DATA:  Sharp central chest pain and shortness of breath EXAM: CT CHEST WITHOUT CONTRAST TECHNIQUE: Multidetector CT imaging of the chest was performed following the standard  protocol without IV contrast. COMPARISON:  10/28/2017 chest radiograph FINDINGS: Cardiovascular: No significant vascular findings. Normal heart size. No pericardial effusion. Mediastinum/Nodes: 13 mm right pretracheal lymph node. No axillary adenopathy. Normal course of the esophagus. Visualized thyroid gland is normal. Lungs/Pleura: There is consolidation in the right upper lobe with numerous adjacent nodular opacities. Smaller focus of consolidation also within the superior right middle lobe. No pleural effusion. There are calcified granulomata in the right lower lobe. Upper Abdomen: No acute abnormality. Musculoskeletal: IMPRESSION: 1. Right upper lobe and middle lobe pneumonia. Followup PA and lateral chest X-ray is recommended in 3-4 weeks following trial of antibiotic therapy to ensure resolution and exclude underlying malignancy. 2. Mildly enlarged right pretracheal lymph node, likely reactive. Electronically Signed   By: Deatra RobinsonKevin  Herman M.D.   On: 10/28/2018 19:16      Allergies as of 10/29/2018   No Known Allergies     Medication List    STOP taking these medications   cephALEXin 500 MG capsule Commonly known as:  KEFLEX     TAKE these medications   albuterol 108 (90 Base) MCG/ACT inhaler Commonly known as:  PROVENTIL HFA Inhale 2 puffs into the lungs every 6 (six) hours as needed for wheezing or shortness of breath.   carvedilol 6.25 MG tablet Commonly known as:  COREG Take 1 tablet by mouth twice daily   cefdinir 300 MG capsule Commonly known as:  OMNICEF Take 1 capsule (300 mg total) by mouth 2 (two) times daily for 4 days.   etodolac 400 MG tablet Commonly known as:  LODINE Take by mouth.   FISH OIL OMEGA-3 1000 MG Caps Take by mouth. Take 2 tab every morning   furosemide 80 MG tablet Commonly known as:  LASIX TAKE 1 TABLET BY MOUTH ONCE DAILY   losartan 25 MG tablet Commonly known as:  COZAAR Take 25 mg by mouth.   predniSONE 20 MG tablet Commonly known as:   DELTASONE Take 2 tablets (40 mg total) by mouth daily with breakfast for 4 days.   rosuvastatin 10 MG tablet Commonly known as:  CRESTOR Take 1 tablet (10 mg total) by mouth daily.   TRUFORM STOCKINGS 20-30MMHG Misc by Does not apply route.          Management plans discussed with the patient and he is in agreement. Stable for discharge home  Patient should follow up with pcp  CODE STATUS:     Code Status Orders  (From admission, onward)         Start     Ordered   10/28/18 2023  Full code  Continuous     10/28/18 2023        Code Status History    This patient has a current code status but no historical code status.      TOTAL TIME TAKING CARE OF THIS PATIENT: 38 minutes.    Note: This dictation was prepared with Dragon dictation along with smaller phrase technology. Any transcriptional errors that result from this process are unintentional.  Isobella Ascher M.D on 10/29/2018 at 12:16 PM  Between 7am to 6pm - Pager - [phone] After 6pm go to [email or web address] - Social research officer, government  [facility]  Office  [phone]  CC: Primary care physician; Carlean Jews, NP

## 2018-10-29 NOTE — Progress Notes (Signed)
[facility] [place] [facility]   PATIENT NAME: Bradley Velez    MR#:  [id number]  DATE OF BIRTH:  May 26, 1966  SUBJECTIVE:   Patient with improved symptoms this morning.  Not requiring oxygen.  No fever overnight  REVIEW OF SYSTEMS:    Review of Systems  Constitutional: Negative for fever, chills weight loss HENT: Negative for ear pain, nosebleeds, congestion, facial swelling, rhinorrhea, neck pain, neck stiffness and ear discharge.   Respiratory:++ for cough, denies shortness of breath, wheezing  Cardiovascular: Negative for chest pain, palpitations and leg swelling.  Gastrointestinal: Negative for heartburn, abdominal pain, vomiting, diarrhea or consitpation Genitourinary: Negative for dysuria, urgency, frequency, hematuria Musculoskeletal: Negative for back pain or joint pain Neurological: Negative for dizziness, seizures, syncope, focal weakness,  numbness and headaches.  Hematological: Does not bruise/bleed easily.  Psychiatric/Behavioral: Negative for hallucinations, confusion, dysphoric mood    Tolerating Diet:yes      DRUG ALLERGIES:  No Known Allergies  VITALS:  Blood pressure 117/83, pulse 67, temperature 98 F (36.7 C), temperature source Oral, resp. rate 20, height 5\' 6"  (1.676 m), weight (!) 151.5 kg, SpO2 93 %.  PHYSICAL EXAMINATION:  Constitutional: Morbidly obese. No distress. HENT: Normocephalic. Marland Kitchen. Oropharynx is clear and moist.  Eyes: Conjunctivae and EOM are normal. PERRLA, no scleral icterus.  Neck: Normal ROM. Neck supple. No JVD. No tracheal deviation. CVS: RRR, S1/S2 +, no murmurs, no gallops, no carotid bruit.  Pulmonary: No respiratory effort with wheezing around pneumonia right side Abdominal: Soft. BS +,  no distension, tenderness, rebound or guarding.  Musculoskeletal: Normal range of motion.  Chronic lymphedema.  Neuro: Alert. CN 2-12 grossly intact. No focal deficits. Skin: Skin is warm and dry. No rash  noted. Psychiatric: Normal mood and affect.      LABORATORY PANEL:   CBC Recent Labs  Lab 10/29/18 0422  WBC 6.8  HGB 13.5  HCT 40.9  PLT 226   ------------------------------------------------------------------------------------------------------------------  Chemistries  Recent Labs  Lab 10/29/18 0422  NA 139  K 3.7  CL 106  CO2 25  GLUCOSE 182*  BUN 16  CREATININE 0.76  CALCIUM 8.5*   ------------------------------------------------------------------------------------------------------------------  Cardiac Enzymes Recent Labs  Lab 10/28/18 1601  TROPONINI <0.03   ------------------------------------------------------------------------------------------------------------------  RADIOLOGY:  Dg Chest 2 View  Result Date: 10/28/2018 CLINICAL DATA:  Chest pain and shortness of breath.  Wheezing. EXAM: CHEST - 2 VIEW COMPARISON:  March 25, 2014 FINDINGS: There is a nodular opacity in the right upper lobe measuring 6 mm, not present previously. There is a 4 mm nodular opacity in the left upper lobe, also not appreciable previously. There is no edema or consolidation. Heart is upper normal in size with pulmonary vascularity normal. There is prominent epicardial fat anteriorly. No adenopathy. No bone lesions. IMPRESSION: Upper lobe nodular opacities, largest measuring 6 mm. Given change from previous study, noncontrast chest CT advised to further evaluate. No frank edema or consolidation. Stable cardiac silhouette. Electronically Signed   By: Bretta BangWilliam  Woodruff III M.D.   On: 10/28/2018 16:22   Ct Chest Wo Contrast  Result Date: 10/28/2018 CLINICAL DATA:  Sharp central chest pain and shortness of breath EXAM: CT CHEST WITHOUT CONTRAST TECHNIQUE: Multidetector CT imaging of the chest was performed following the standard protocol without IV contrast. COMPARISON:  10/28/2017 chest radiograph FINDINGS: Cardiovascular: No significant vascular findings. Normal heart size. No  pericardial effusion. Mediastinum/Nodes: 13 mm right pretracheal lymph node. No axillary adenopathy. Normal course of the esophagus. Visualized thyroid gland  is normal. Lungs/Pleura: There is consolidation in the right upper lobe with numerous adjacent nodular opacities. Smaller focus of consolidation also within the superior right middle lobe. No pleural effusion. There are calcified granulomata in the right lower lobe. Upper Abdomen: No acute abnormality. Musculoskeletal: IMPRESSION: 1. Right upper lobe and middle lobe pneumonia. Followup PA and lateral chest X-ray is recommended in 3-4 weeks following trial of antibiotic therapy to ensure resolution and exclude underlying malignancy. 2. Mildly enlarged right pretracheal lymph node, likely reactive. Electronically Signed   By: Deatra Robinson M.D.   On: 10/28/2018 19:16     ASSESSMENT AND PLAN:   53 year old morbidly obese male with chronic lymphedema and sleep apnea and remote history of chronic systolic heart failure who presents with shortness of breath and cough.  1.  Multilobar pneumonia as etiology of patient's shortness of breath: Patient symptoms have improved He will continue antibiotics.  He needs follow-up chest x-ray in 3 to 4 weeks.  2.  Hypokalemia: This was repleted  3.  OSA: Continue CPAP  4.  History of chronic systolic heart failure ejection fraction 40% in 2016: Patient did not want repeat echocardiogram.  5.  Essential hypertension: Continue losartan and Coreg  6.  Chronic lymphedema: Patient wears compression hose    Patient medically stable for discharge  Management plans discussed with the patient and he is in agreement.  CODE STATUS: full  TOTAL TIME TAKING CARE OF THIS PATIENT: 30 minutes.     POSSIBLE D/C today, DEPENDING ON CLINICAL CONDITION.   Octavious Zidek M.D on 10/29/2018 at 12:10 PM  Between 7am to 6pm - Pager - [phone] After 6pm go to [email or web address] - password [email or web address]  [facility]  Office  [phone]  CC: Primary care physician; Carlean Jews, NP  Note: This dictation was prepared with Dragon dictation along with smaller phrase technology. Any transcriptional errors that result from this process are unintentional.

## 2018-10-29 NOTE — Progress Notes (Signed)
Discharge teaching given to patient, patient verbalized understanding and had no questions. Patient IV removed. Patient will be transported home by family. All patient belongings gathered prior to leaving.  

## 2018-10-29 NOTE — Discharge Instructions (Signed)
Community-Acquired Pneumonia, Adult °Pneumonia is an infection of the lungs. There are different types of pneumonia. One type can develop while a person is in a hospital. A different type, called community-acquired pneumonia, develops in people who are not, or have not recently been, in the hospital or other health care facility. °What are the causes? ° °Pneumonia may be caused by bacteria, viruses, or funguses. Community-acquired pneumonia is often caused by Streptococcus pneumonia bacteria. These bacteria are often passed from one person to another by breathing in droplets from the cough or sneeze of an infected person. °What increases the risk? °The condition is more likely to develop in: °· People who have chronic diseases, such as chronic obstructive pulmonary disease (COPD), asthma, congestive heart failure, cystic fibrosis, diabetes, or kidney disease. °· People who have early-stage or late-stage HIV. °· People who have sickle cell disease. °· People who have had their spleen removed (splenectomy). °· People who have poor dental hygiene. °· People who have medical conditions that increase the risk of breathing in (aspirating) secretions their own mouth and nose. °· People who have a weakened immune system (immunocompromised). °· People who smoke. °· People who travel to areas where pneumonia-causing germs commonly exist. °· People who are around animal habitats or animals that have pneumonia-causing germs, including birds, bats, rabbits, cats, and farm animals. °What are the signs or symptoms? °Symptoms of this condition include: °· A dry cough. °· A wet (productive) cough. °· Fever. °· Sweating. °· Chest pain, especially when breathing deeply or coughing. °· Rapid breathing or difficulty breathing. °· Shortness of breath. °· Shaking chills. °· Fatigue. °· Muscle aches. °How is this diagnosed? °Your health care provider will take a medical history and perform a physical exam. You may also have other tests,  including: °· Imaging studies of your chest, including X-rays. °· Tests to check your blood oxygen level and other blood gases. °· Other tests on blood, mucus (sputum), fluid around your lungs (pleural fluid), and urine. °If your pneumonia is severe, other tests may be done to identify the specific cause of your illness. °How is this treated? °The type of treatment that you receive depends on many factors, such as the cause of your pneumonia, the medicines you take, and other medical conditions that you have. For most adults, treatment and recovery from pneumonia may occur at home. In some cases, treatment must happen in a hospital. Treatment may include: °· Antibiotic medicines, if the pneumonia was caused by bacteria. °· Antiviral medicines, if the pneumonia was caused by a virus. °· Medicines that are given by mouth or through an IV tube. °· Oxygen. °· Respiratory therapy. °Although rare, treating severe pneumonia may include: °· Mechanical ventilation. This is done if you are not breathing well on your own and you cannot maintain a safe blood oxygen level. °· Thoracentesis. This procedure removes fluid around one lung or both lungs to help you breathe better. °Follow these instructions at home: ° °· Take over-the-counter and prescription medicines only as told by your health care provider. °? Only take cough medicine if you are losing sleep. Understand that cough medicine can prevent your body’s natural ability to remove mucus from your lungs. °? If you were prescribed an antibiotic medicine, take it as told by your health care provider. Do not stop taking the antibiotic even if you start to feel better. °· Sleep in a semi-upright position at night. Try sleeping in a reclining chair, or place a few pillows under your head. °· Do not use tobacco products, including cigarettes, chewing tobacco, and e-cigarettes.   If you need help quitting, ask your health care provider. °· Drink enough water to keep your urine  clear or pale yellow. This will help to thin out mucus secretions in your lungs. °How is this prevented? °There are ways that you can decrease your risk of developing community-acquired pneumonia. Consider getting a pneumococcal vaccine if: °· You are older than 53 years of age. °· You are older than 53 years of age and are undergoing cancer treatment, have chronic lung disease, or have other medical conditions that affect your immune system. Ask your health care provider if this applies to you. °There are different types and schedules of pneumococcal vaccines. Ask your health care provider which vaccination option is best for you. °You may also prevent community-acquired pneumonia if you take these actions: °· Get an influenza vaccine every year. Ask your health care provider which type of influenza vaccine is best for you. °· Go to the dentist on a regular basis. °· Wash your hands often. Use hand sanitizer if soap and water are not available. °Contact a health care provider if: °· You have a fever. °· You are losing sleep because you cannot control your cough with cough medicine. °Get help right away if: °· You have worsening shortness of breath. °· You have increased chest pain. °· Your sickness becomes worse, especially if you are an older adult or have a weakened immune system. °· You cough up blood. °This information is not intended to replace advice given to you by your health care provider. Make sure you discuss any questions you have with your health care provider. °Document Released: 10/09/2005 Document Revised: 06/28/2017 Document Reviewed: 02/03/2015 °Elsevier Interactive Patient Education © 2019 [employer] Inc[employer] ° °

## 2018-10-30 LAB — HIV ANTIBODY (ROUTINE TESTING W REFLEX): HIV SCREEN 4TH GENERATION: NONREACTIVE

## 2018-10-31 ENCOUNTER — Ambulatory Visit: Payer: Self-pay | Admitting: Nurse Practitioner

## 2018-11-02 LAB — CULTURE, BLOOD (ROUTINE X 2)
Culture: NO GROWTH
Culture: NO GROWTH
SPECIAL REQUESTS: ADEQUATE
Special Requests: ADEQUATE

## 2019-01-06 ENCOUNTER — Other Ambulatory Visit: Payer: Self-pay

## 2019-01-06 ENCOUNTER — Ambulatory Visit (INDEPENDENT_AMBULATORY_CARE_PROVIDER_SITE_OTHER): Payer: BLUE CROSS/BLUE SHIELD | Admitting: Adult Health

## 2019-01-06 ENCOUNTER — Encounter: Payer: Self-pay | Admitting: Adult Health

## 2019-01-06 VITALS — BP 143/106 | HR 81 | Temp 97.8°F | Resp 16 | Ht 66.0 in | Wt 320.0 lb

## 2019-01-06 DIAGNOSIS — J988 Other specified respiratory disorders: Secondary | ICD-10-CM

## 2019-01-06 DIAGNOSIS — J4520 Mild intermittent asthma, uncomplicated: Secondary | ICD-10-CM

## 2019-01-06 DIAGNOSIS — I10 Essential (primary) hypertension: Secondary | ICD-10-CM

## 2019-01-06 DIAGNOSIS — R05 Cough: Secondary | ICD-10-CM

## 2019-01-06 DIAGNOSIS — R059 Cough, unspecified: Secondary | ICD-10-CM

## 2019-01-06 MED ORDER — PREDNISONE 10 MG PO TABS: ORAL_TABLET | ORAL | 0 refills | Status: DC

## 2019-01-06 MED ORDER — AZITHROMYCIN 250 MG PO TABS: ORAL_TABLET | ORAL | 0 refills | Status: DC

## 2019-01-06 MED ORDER — ALBUTEROL SULFATE HFA 108 (90 BASE) MCG/ACT IN AERS: 2.0000 | INHALATION_SPRAY | Freq: Four times a day (QID) | RESPIRATORY_TRACT | 5 refills | Status: DC | PRN

## 2019-01-06 MED ORDER — IPRATROPIUM-ALBUTEROL 0.5-2.5 (3) MG/3ML IN SOLN
3.0000 mL | Freq: Once | RESPIRATORY_TRACT | Status: AC
  Administered 2019-01-06: 3 mL via RESPIRATORY_TRACT

## 2019-01-06 NOTE — Progress Notes (Signed)
[facility] [address] [place], [place] [address]  Internal MEDICINE  Office Visit Note  Patient Name: Bradley Velez  [id number]  [id number]  Date of Service: 01/06/2019  Chief Complaint  Patient presents with  . Wheezing    cough , was on [place] trip and by the end of the trip all kids was sick on bus      HPI Pt is here for a sick visit. 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 Half way through the trip many of the kids were coughing and sick.  He states when he got home he started to feel bad.  He has been coughing, wheezing, DOE and a lowe grade temp.  Pt reports he was recently treated for PNA at [facility].  He has a history of CHF,OSA and HTN.    Current Medication:  Outpatient Encounter Medications as of 01/06/2019  Medication Sig  . albuterol (PROVENTIL HFA) 108 (90 Base) MCG/ACT inhaler Inhale 2 puffs into the lungs every 6 (six) hours as needed for wheezing or shortness of breath.  . carvedilol (COREG) 6.25 MG tablet Take 1 tablet by mouth twice daily  . Elastic Bandages & Supports (TRUFORM STOCKINGS 20-30MMHG) MISC by Does not apply route.  . etodolac (LODINE) 400 MG tablet Take by mouth.  . furosemide (LASIX) 80 MG tablet TAKE 1 TABLET BY MOUTH ONCE DAILY  . losartan (COZAAR) 25 MG tablet Take 25 mg by mouth.  . Omega-3 Fatty Acids (FISH OIL OMEGA-3) 1000 MG CAPS Take by mouth. Take 2 tab every morning  . rosuvastatin (CRESTOR) 10 MG tablet Take 1 tablet (10 mg total) by mouth daily.   No facility-administered encounter medications on file as of 01/06/2019.       Medical History: Past Medical History:  Diagnosis Date  . Hyperlipidemia   . Hypertension   . Obesity   . Sleep apnea      Vital Signs: BP (!) 143/106   Pulse 81   Temp 97.8 F (36.6 C) (Oral)   Resp 16   Ht 5\' 6"  (1.676 m)   Wt (!) 320 lb (145.2 kg)   SpO2 95%   BMI 51.65 kg/m    Review of Systems  Constitutional: Positive for  fever. Negative for chills, fatigue and unexpected weight change.  HENT: Negative.  Negative for congestion, rhinorrhea, sneezing and sore throat.   Eyes: Negative for redness.  Respiratory: Positive for cough and shortness of breath. Negative for chest tightness.   Cardiovascular: Negative.  Negative for chest pain and palpitations.  Gastrointestinal: Negative.  Negative for abdominal pain, constipation, diarrhea, nausea and vomiting.  Endocrine: Negative.   Genitourinary: Negative.  Negative for dysuria and frequency.  Musculoskeletal: Negative.  Negative for arthralgias, back pain, joint swelling and neck pain.  Skin: Negative.  Negative for rash.  Allergic/Immunologic: Negative.   Neurological: Negative.  Negative for tremors and numbness.  Hematological: Negative for adenopathy. Does not bruise/bleed easily.  Psychiatric/Behavioral: Negative.  Negative for behavioral problems, sleep disturbance and suicidal ideas. The patient is not nervous/anxious.     Physical Exam Vitals signs and nursing note reviewed.  Constitutional:      General: He is not in acute distress.    Appearance: He is well-developed. He is not diaphoretic.  HENT:     Head: Normocephalic and atraumatic.     Mouth/Throat:     Pharynx: No oropharyngeal exudate.  Eyes:  Pupils: Pupils are equal, round, and reactive to light.  Neck:     Musculoskeletal: Normal range of motion and neck supple.     Thyroid: No thyromegaly.     Vascular: No JVD.     Trachea: No tracheal deviation.  Cardiovascular:     Rate and Rhythm: Normal rate and regular rhythm.     Heart sounds: Normal heart sounds. No murmur. No friction rub. No gallop.   Pulmonary:     Effort: Pulmonary effort is normal. No respiratory distress.     Breath sounds: Normal breath sounds. No wheezing or rales.  Chest:     Chest wall: No tenderness.  Abdominal:     Palpations: Abdomen is soft.     Tenderness: There is no abdominal tenderness. There is no  guarding.  Musculoskeletal: Normal range of motion.  Lymphadenopathy:     Cervical: No cervical adenopathy.  Skin:    General: Skin is warm and dry.  Neurological:     Mental Status: He is alert and oriented to person, place, and time.     Cranial Nerves: No cranial nerve deficit.  Psychiatric:        Behavior: Behavior normal.        Thought Content: Thought content normal.        Judgment: Judgment normal.    Assessment/Plan: 1. Respiratory infection Patient noted with a prednisone taper as well as azithromycin course.  Patient to take all medication as prescribed and return to clinic in 7 to 10 days if symptoms fail to improve or if he gets a fever that is uncontrollable. - azithromycin (ZITHROMAX) 250 MG tablet; Take as directed  Dispense: 6 tablet; Refill: 0 - predniSONE (DELTASONE) 10 MG tablet; Use per dose pack  Dispense: 21 tablet; Refill: 0  2. Essential hypertension Elevated today.  Likely due to OTC medications and illness.  Rechecked at 146/98.  Continue to follow at future visits.   3. Mild intermittent asthma in adult without complication Patient given DuoNeb nebulizer treatment here in office.  Patient good results and lung sounds improved with medication.  I have encouraged patient to use albuterol inhaler 2 puffs every 6 hours around the clock for the first day and a half and then may use as needed afterwards.  We discussed signs and symptoms that he would need to seek emergent treatment for as far as his breathing goes. - ipratropium-albuterol (DUONEB) 0.5-2.5 (3) MG/3ML nebulizer solution 3 mL - albuterol (PROVENTIL HFA) 108 (90 Base) MCG/ACT inhaler; Inhale 2 puffs into the lungs every 6 (six) hours as needed for wheezing or shortness of breath.  Dispense: 1 Inhaler; Refill: 5  4. Cough Moderate cough.  I believe the prednisone will help with this as well as the albuterol.  General Counseling: darrien pentland understanding of the findings of todays visit and  agrees with plan of treatment. I have discussed any further diagnostic evaluation that may be needed or ordered today. We also reviewed his medications today. he has been encouraged to call the office with any questions or concerns that should arise related to todays visit.   No orders of the defined types were placed in this encounter.   No orders of the defined types were placed in this encounter.   Time spent: 25 Minutes  This patient was seen by Blima Ledger AGNP-C in Collaboration with Dr Lyndon Code as a part of collaborative care agreement.  Johnna Acosta AGNP-C Internal Medicine

## 2019-01-06 NOTE — Patient Instructions (Signed)

## 2019-01-15 ENCOUNTER — Other Ambulatory Visit: Payer: Self-pay

## 2019-01-15 MED ORDER — ALBUTEROL SULFATE HFA 108 (90 BASE) MCG/ACT IN AERS: 2.0000 | INHALATION_SPRAY | Freq: Four times a day (QID) | RESPIRATORY_TRACT | 1 refills | Status: AC | PRN

## 2019-02-21 ENCOUNTER — Other Ambulatory Visit: Payer: Self-pay

## 2019-02-21 MED ORDER — ROSUVASTATIN CALCIUM 10 MG PO TABS: 10.0000 mg | ORAL_TABLET | Freq: Every day | ORAL | 0 refills | Status: DC

## 2019-03-05 ENCOUNTER — Other Ambulatory Visit: Payer: Self-pay

## 2019-03-05 MED ORDER — CARVEDILOL 6.25 MG PO TABS: ORAL_TABLET | ORAL | 0 refills | Status: DC

## 2019-03-28 ENCOUNTER — Ambulatory Visit: Payer: Self-pay | Admitting: Nurse Practitioner

## 2019-03-28 ENCOUNTER — Other Ambulatory Visit: Payer: Self-pay

## 2019-03-28 ENCOUNTER — Encounter: Payer: Self-pay | Admitting: Nurse Practitioner

## 2019-03-28 VITALS — Ht 65.0 in | Wt 327.0 lb

## 2019-03-28 DIAGNOSIS — I10 Essential (primary) hypertension: Secondary | ICD-10-CM

## 2019-03-28 DIAGNOSIS — E782 Mixed hyperlipidemia: Secondary | ICD-10-CM

## 2019-03-28 DIAGNOSIS — I5032 Chronic diastolic (congestive) heart failure: Secondary | ICD-10-CM

## 2019-03-28 MED ORDER — ROSUVASTATIN CALCIUM 10 MG PO TABS: 10.0000 mg | ORAL_TABLET | Freq: Every day | ORAL | 1 refills | Status: DC

## 2019-03-28 MED ORDER — LOSARTAN POTASSIUM 25 MG PO TABS: 25.0000 mg | ORAL_TABLET | Freq: Every day | ORAL | 1 refills | Status: DC

## 2019-03-28 MED ORDER — CARVEDILOL 6.25 MG PO TABS: ORAL_TABLET | ORAL | 1 refills | Status: DC

## 2019-03-28 MED ORDER — FUROSEMIDE 80 MG PO TABS: ORAL_TABLET | ORAL | 1 refills | Status: DC

## 2019-03-28 NOTE — Progress Notes (Signed)
[facility] [address] [place], [place] [address]  Internal MEDICINE  Telephone Visit  Patient Name: Bradley Velez  [id number]  [id number]  Date of Service: 04/02/2019  I connected with the patient at 12:20pm by webcam and verified the patients identity using two identifiers.   I discussed the limitations, risks, security and privacy concerns of performing an evaluation and management service by webcam and the availability of in person appointments. I also discussed with the patient that there may be a patient responsible charge related to the service.  The patient expressed understanding and agrees to proceed.    Chief Complaint  Patient presents with  . Telephone Assessment  . Telephone Screen  . Hyperlipidemia  . Hypertension    The patient has been contacted via webcam for follow up visit due to concerns for spread of novel coronavirus. He has no concerns or complaints today. Does need to have refills for his routine medications.   Hypertension  This is a chronic problem. The current episode started more than 1 year ago. The problem is unchanged. The problem is controlled. Associated symptoms include malaise/fatigue, orthopnea and shortness of breath. Pertinent negatives include no chest pain, headaches, neck pain or palpitations. Risk factors for coronary artery disease include sedentary lifestyle, stress, obesity and male gender. Past treatments include angiotensin blockers and beta blockers. The current treatment provides moderate improvement. Compliance problems include exercise.  Hypertensive end-organ damage includes CAD/MI and left ventricular hypertrophy. Identifiable causes of hypertension include sleep apnea.       Current Medication: Outpatient Encounter Medications as of 03/28/2019  Medication Sig  . albuterol (VENTOLIN HFA) 108 (90 Base) MCG/ACT inhaler Inhale 2 puffs into the lungs every 6 (six) hours as needed for wheezing or shortness of breath.  .  carvedilol (COREG) 6.25 MG tablet Take 1 tablet by mouth twice daily  . Elastic Bandages & Supports (TRUFORM STOCKINGS 20-30MMHG) MISC by Does not apply route.  . furosemide (LASIX) 80 MG tablet TAKE 1 TABLET BY MOUTH ONCE DAILY  . Omega-3 Fatty Acids (FISH OIL OMEGA-3) 1000 MG CAPS Take by mouth. Take 2 tab every morning  . rosuvastatin (CRESTOR) 10 MG tablet Take 1 tablet (10 mg total) by mouth daily.  . [DISCONTINUED] carvedilol (COREG) 6.25 MG tablet Take 1 tablet by mouth twice daily  . [DISCONTINUED] furosemide (LASIX) 80 MG tablet TAKE 1 TABLET BY MOUTH ONCE DAILY  . [DISCONTINUED] rosuvastatin (CRESTOR) 10 MG tablet Take 1 tablet (10 mg total) by mouth daily.  Marland Kitchen. albuterol (PROVENTIL HFA) 108 (90 Base) MCG/ACT inhaler Inhale 2 puffs into the lungs every 6 (six) hours as needed for wheezing or shortness of breath. (Patient not taking: Reported on 01/15/2019)  . azithromycin (ZITHROMAX) 250 MG tablet Take as directed (Patient not taking: Reported on 03/28/2019)  . etodolac (LODINE) 400 MG tablet Take by mouth.  . losartan (COZAAR) 25 MG tablet Take 1 tablet (25 mg total) by mouth daily.  . predniSONE (DELTASONE) 10 MG tablet Use per dose pack (Patient not taking: Reported on 03/28/2019)  . [DISCONTINUED] losartan (COZAAR) 25 MG tablet Take 25 mg by mouth.   No facility-administered encounter medications on file as of 03/28/2019.     Surgical History: Past Surgical History:  Procedure Laterality Date  . EYE SURGERY Bilateral 1970    Medical History: Past Medical History:  Diagnosis Date  . Hyperlipidemia   . Hypertension   . Obesity   . Sleep apnea     Family History: Family History  Problem Relation Age of Onset  . Emphysema Father   . Lung cancer Father     Social History   Socioeconomic History  . Marital status: Married    Spouse name: Not on file  . Number of children: Not on file  . Years of education: Not on file  . Highest education level: Not on file   Occupational History  . Not on file  Social Needs  . Financial resource strain: Not on file  . Food insecurity:    Worry: Not on file    Inability: Not on file  . Transportation needs:    Medical: Not on file    Non-medical: Not on file  Tobacco Use  . Smoking status: Never Smoker  . Smokeless tobacco: Never Used  Substance and Sexual Activity  . Alcohol use: Yes    Frequency: Never    Comment: social  . Drug use: No  . Sexual activity: Not on file  Lifestyle  . Physical activity:    Days per week: Not on file    Minutes per session: Not on file  . Stress: Not on file  Relationships  . Social connections:    Talks on phone: Not on file    Gets together: Not on file    Attends religious service: Not on file    Active member of club or organization: Not on file    Attends meetings of clubs or organizations: Not on file    Relationship status: Not on file  . Intimate partner violence:    Fear of current or ex partner: Not on file    Emotionally abused: Not on file    Physically abused: Not on file    Forced sexual activity: Not on file  Other Topics Concern  . Not on file  Social History Narrative  . Not on file      Review of Systems  Constitutional: Positive for malaise/fatigue.  Respiratory: Positive for shortness of breath.   Cardiovascular: Positive for orthopnea. Negative for chest pain and palpitations.  Musculoskeletal: Negative for neck pain.  Neurological: Negative for headaches.    Today's Vitals   03/28/19 1149  Weight: (!) 327 lb (148.3 kg)  Height: 5\' 5"  (1.651 m)   Body mass index is 54.42 kg/m.   Observation/Objective:  The patient is alert and oriented. He is pleasant and answering all questions appropriately. Breathing is non-labored. He is in no acute distress.    Assessment/Plan:  1. Essential hypertension Stable. Continue bp medication as prescribed  - carvedilol (COREG) 6.25 MG tablet; Take 1 tablet by mouth twice daily   Dispense: 180 tablet; Refill: 1 - losartan (COZAAR) 25 MG tablet; Take 1 tablet (25 mg total) by mouth daily.  Dispense: 90 tablet; Refill: 1  2. Congestive heart failure with left ventricular diastolic dysfunction, chronic (HCC) Continue furosemide as prescribed per cardiology. Refills provided today.  - furosemide (LASIX) 80 MG tablet; TAKE 1 TABLET BY MOUTH ONCE DAILY  Dispense: 90 tablet; Refill: 1  3. Mixed hyperlipidemia Continue crestor as prescribed  - rosuvastatin (CRESTOR) 10 MG tablet; Take 1 tablet (10 mg total) by mouth daily.  Dispense: 90 tablet; Refill: 1  General Counseling: ries yamanaka understanding of the findings of today's phone visit and agrees with plan of treatment. I have discussed any further diagnostic evaluation that may be needed or ordered today. We also reviewed his medications today. he has been encouraged to call the office with any questions or concerns that should  arise related to todays visit.  Hypertension Counseling:   The following hypertensive lifestyle modification were recommended and discussed:  1. Limiting alcohol intake to less than 1 oz/day of ethanol:(24 oz of beer or 8 oz of wine or 2 oz of 100-proof whiskey). 2. Take baby ASA 81 mg daily. 3. Importance of regular aerobic exercise and losing weight. 4. Reduce dietary saturated fat and cholesterol intake for overall cardiovascular health. 5. Maintaining adequate dietary potassium, calcium, and magnesium intake. 6. Regular monitoring of the blood pressure. 7. Reduce sodium intake to less than 100 mmol/day (less than 2.3 gm of sodium or less than 6 gm of sodium choride)   This patient was seen by Vincent Gros FNP Collaboration with Dr Lyndon Code as a part of collaborative care agreement   Meds ordered this encounter  Medications  . carvedilol (COREG) 6.25 MG tablet    Sig: Take 1 tablet by mouth twice daily    Dispense:  180 tablet    Refill:  1    Pt need appt for further  refills    Order Specific Question:   Supervising Provider    Answer:   Lyndon Code [[id number]]  . rosuvastatin (CRESTOR) 10 MG tablet    Sig: Take 1 tablet (10 mg total) by mouth daily.    Dispense:  90 tablet    Refill:  1    Order Specific Question:   Supervising Provider    Answer:   Lyndon Code [[id number]]  . furosemide (LASIX) 80 MG tablet    Sig: TAKE 1 TABLET BY MOUTH ONCE DAILY    Dispense:  90 tablet    Refill:  1    Order Specific Question:   Supervising Provider    Answer:   Lyndon Code [[id number]]  . losartan (COZAAR) 25 MG tablet    Sig: Take 1 tablet (25 mg total) by mouth daily.    Dispense:  90 tablet    Refill:  1    Order Specific Question:   Supervising Provider    Answer:   Lyndon Code [[id number]]    Time spent: 4 Minutes    Dr Lyndon Code Internal medicine

## 2019-04-01 ENCOUNTER — Ambulatory Visit: Payer: Self-pay | Admitting: Internal Medicine

## 2019-04-02 DIAGNOSIS — I5032 Chronic diastolic (congestive) heart failure: Secondary | ICD-10-CM | POA: Insufficient documentation

## 2019-06-27 ENCOUNTER — Ambulatory Visit: Payer: Self-pay | Admitting: Nurse Practitioner

## 2020-01-21 ENCOUNTER — Telehealth: Payer: Self-pay

## 2020-01-21 NOTE — Telephone Encounter (Signed)
Lmom to confirm and screen for 01-26-20 ov. 

## 2020-01-22 ENCOUNTER — Telehealth: Payer: Self-pay

## 2020-01-22 NOTE — Telephone Encounter (Signed)
Confirmed appointment on 01/27/2020 and screened for covid. kl[facility] 

## 2020-01-26 ENCOUNTER — Other Ambulatory Visit: Payer: Self-pay

## 2020-01-26 ENCOUNTER — Ambulatory Visit: Payer: Self-pay | Admitting: Nurse Practitioner

## 2020-01-26 ENCOUNTER — Encounter: Payer: Self-pay | Admitting: Nurse Practitioner

## 2020-01-26 DIAGNOSIS — I5032 Chronic diastolic (congestive) heart failure: Secondary | ICD-10-CM

## 2020-01-26 DIAGNOSIS — I10 Essential (primary) hypertension: Secondary | ICD-10-CM

## 2020-01-26 DIAGNOSIS — E782 Mixed hyperlipidemia: Secondary | ICD-10-CM

## 2020-01-26 MED ORDER — CARVEDILOL 6.25 MG PO TABS: ORAL_TABLET | ORAL | 1 refills | Status: DC

## 2020-01-26 MED ORDER — FUROSEMIDE 80 MG PO TABS: ORAL_TABLET | ORAL | 1 refills | Status: DC

## 2020-01-26 MED ORDER — ROSUVASTATIN CALCIUM 10 MG PO TABS: 10.0000 mg | ORAL_TABLET | Freq: Every day | ORAL | 1 refills | Status: DC

## 2020-01-26 NOTE — Progress Notes (Signed)
[facility] [address] [place], [place] [address]  Internal MEDICINE  Office Visit Note  Patient Name: Bradley Velez  [id number]  [id number]  Date of Service: 02/07/2020  Chief Complaint  Patient presents with  . Hypertension  . Hyperlipidemia  . Medication Refill    rosuvastatin, lasix and carvedilol    The patient is here for follow up visit. Blood pressure is doing well. Patient did have initial Pfizer vaccine last month. Will have second vaccine tomorrow.  Patient is due for routine, fasting labs. Would like to hold off on this for now, as he does not have health insurance. The patient's blood pressure is well managed. Has not had flares of cellulitis in lower legs. He has lost 13 pounds since his last, in office visit. He has cut back calorie intake and has increased his physical acivity.       Current Medication: Outpatient Encounter Medications as of 01/26/2020  Medication Sig  . albuterol (VENTOLIN HFA) 108 (90 Base) MCG/ACT inhaler Inhale 2 puffs into the lungs every 6 (six) hours as needed for wheezing or shortness of breath.  . carvedilol (COREG) 6.25 MG tablet Take 1 tablet by mouth twice daily  . Elastic Bandages & Supports (TRUFORM STOCKINGS 20-30MMHG) MISC by Does not apply route.  . furosemide (LASIX) 80 MG tablet TAKE 1 TABLET BY MOUTH ONCE DAILY  . rosuvastatin (CRESTOR) 10 MG tablet Take 1 tablet (10 mg total) by mouth daily.  . [DISCONTINUED] carvedilol (COREG) 6.25 MG tablet Take 1 tablet by mouth twice daily  . [DISCONTINUED] furosemide (LASIX) 80 MG tablet TAKE 1 TABLET BY MOUTH ONCE DAILY  . [DISCONTINUED] rosuvastatin (CRESTOR) 10 MG tablet Take 1 tablet (10 mg total) by mouth daily.  . [DISCONTINUED] albuterol (PROVENTIL HFA) 108 (90 Base) MCG/ACT inhaler Inhale 2 puffs into the lungs every 6 (six) hours as needed for wheezing or shortness of breath. (Patient not taking: Reported on 01/15/2019)  . [DISCONTINUED] azithromycin (ZITHROMAX) 250 MG  tablet Take as directed (Patient not taking: Reported on 03/28/2019)  . [DISCONTINUED] etodolac (LODINE) 400 MG tablet Take by mouth.  . [DISCONTINUED] losartan (COZAAR) 25 MG tablet Take 1 tablet (25 mg total) by mouth daily. (Patient not taking: Reported on 01/26/2020)  . [DISCONTINUED] Omega-3 Fatty Acids (FISH OIL OMEGA-3) 1000 MG CAPS Take by mouth. Take 2 tab every morning  . [DISCONTINUED] predniSONE (DELTASONE) 10 MG tablet Use per dose pack (Patient not taking: Reported on 03/28/2019)   No facility-administered encounter medications on file as of 01/26/2020.    Surgical History: Past Surgical History:  Procedure Laterality Date  . EYE SURGERY Bilateral 1970    Medical History: Past Medical History:  Diagnosis Date  . Hyperlipidemia   . Hypertension   . Obesity   . Sleep apnea     Family History: Family History  Problem Relation Age of Onset  . Emphysema Father   . Lung cancer Father     Social History   Socioeconomic History  . Marital status: Married    Spouse name: Not on file  . Number of children: Not on file  . Years of education: Not on file  . Highest education level: Not on file  Occupational History  . Not on file  Tobacco Use  . Smoking status: Never Smoker  . Smokeless tobacco: Never Used  Substance and Sexual Activity  . Alcohol use: Yes    Comment: social  . Drug use: No  . Sexual activity: Not on file  Other Topics Concern  . Not on file  Social History Narrative  . Not on file   Social Determinants of Health   Financial Resource Strain:   . Difficulty of Paying Living Expenses:   Food Insecurity:   . Worried About Programme researcher, broadcasting/film/video in the Last Year:   . Barista in the Last Year:   Transportation Needs:   . Freight forwarder (Medical):   Marland Kitchen Lack of Transportation (Non-Medical):   Physical Activity:   . Days of Exercise per Week:   . Minutes of Exercise per Session:   Stress:   . Feeling of Stress :   Social Connections:    . Frequency of Communication with Friends and Family:   . Frequency of Social Gatherings with Friends and Family:   . Attends Religious Services:   . Active Member of Clubs or Organizations:   . Attends Banker Meetings:   Marland Kitchen Marital Status:   Intimate Partner Violence:   . Fear of Current or Ex-Partner:   . Emotionally Abused:   Marland Kitchen Physically Abused:   . Sexually Abused:       Review of Systems  Constitutional: Negative for chills, fatigue and unexpected weight change.       Weight loss of 13 pounds since last visit.   HENT: Negative for congestion, postnasal drip, rhinorrhea, sneezing and sore throat.   Respiratory: Negative for cough, chest tightness and shortness of breath.   Cardiovascular: Positive for leg swelling. Negative for chest pain and palpitations.  Gastrointestinal: Negative for abdominal pain, constipation, diarrhea, nausea and vomiting.  Endocrine: Negative for cold intolerance, heat intolerance, polydipsia and polyuria.  Musculoskeletal: Negative for arthralgias, back pain, joint swelling and neck pain.  Skin: Negative for rash.  Allergic/Immunologic: Negative for environmental allergies.  Neurological: Negative for dizziness, tremors, numbness and headaches.  Hematological: Negative for adenopathy. Does not bruise/bleed easily.  Psychiatric/Behavioral: Negative for behavioral problems (Depression), sleep disturbance and suicidal ideas. The patient is not nervous/anxious.     Today's Vitals   01/26/20 1607  BP: 132/85  Pulse: 75  Resp: 16  Temp: (!) 97.5 F (36.4 C)  SpO2: 95%  Weight: (!) 314 lb 6.4 oz (142.6 kg)  Height: 5\' 5"  (1.651 m)   Body mass index is 52.32 kg/m.  Physical Exam Vitals and nursing note reviewed.  Constitutional:      General: He is not in acute distress.    Appearance: Normal appearance. He is well-developed. He is obese. He is not diaphoretic.  HENT:     Head: Normocephalic and atraumatic.     Nose: Nose  normal.     Mouth/Throat:     Pharynx: No oropharyngeal exudate.  Eyes:     Pupils: Pupils are equal, round, and reactive to light.  Neck:     Thyroid: No thyromegaly.     Vascular: No JVD.     Trachea: No tracheal deviation.  Cardiovascular:     Rate and Rhythm: Normal rate and regular rhythm.     Heart sounds: Murmur present. No friction rub. No gallop.   Pulmonary:     Effort: Pulmonary effort is normal. No respiratory distress.     Breath sounds: Normal breath sounds. No wheezing or rales.  Chest:     Chest wall: No tenderness.  Abdominal:     General: Bowel sounds are normal.     Palpations: Abdomen is soft.  Musculoskeletal:        General: Normal  range of motion.     Cervical back: Normal range of motion and neck supple.  Lymphadenopathy:     Cervical: No cervical adenopathy.  Skin:    General: Skin is warm and dry.  Neurological:     Mental Status: He is alert and oriented to person, place, and time.     Cranial Nerves: No cranial nerve deficit.  Psychiatric:        Mood and Affect: Mood normal.        Behavior: Behavior normal.        Thought Content: Thought content normal.        Judgment: Judgment normal.     Assessment/Plan: 1. Essential hypertension Stable. Continue coreg as prescribed  - carvedilol (COREG) 6.25 MG tablet; Take 1 tablet by mouth twice daily  Dispense: 180 tablet; Refill: 1  2. Congestive heart failure with left ventricular diastolic dysfunction, chronic (HCC) Continue furosemide 80mg  daily. Continue regular visits with cardiology as scheduled.  - furosemide (LASIX) 80 MG tablet; TAKE 1 TABLET BY MOUTH ONCE DAILY  Dispense: 90 tablet; Refill: 1  3. Mixed hyperlipidemia Continue rosuvastatin as prescribed  - rosuvastatin (CRESTOR) 10 MG tablet; Take 1 tablet (10 mg total) by mouth daily.  Dispense: 90 tablet; Refill: 1  General Counseling: Kizzie Cotten streeper understanding of the findings of todays visit and agrees with plan of treatment.  I have discussed any further diagnostic evaluation that may be needed or ordered today. We also reviewed his medications today. he has been encouraged to call the office with any questions or concerns that should arise related to todays visit.  Hypertension Counseling:   The following hypertensive lifestyle modification were recommended and discussed:  1. Limiting alcohol intake to less than 1 oz/day of ethanol:(24 oz of beer or 8 oz of wine or 2 oz of 100-proof whiskey). 2. Take baby ASA 81 mg daily. 3. Importance of regular aerobic exercise and losing weight. 4. Reduce dietary saturated fat and cholesterol intake for overall cardiovascular health. 5. Maintaining adequate dietary potassium, calcium, and magnesium intake. 6. Regular monitoring of the blood pressure. 7. Reduce sodium intake to less than 100 mmol/day (less than 2.3 gm of sodium or less than 6 gm of sodium choride)   This patient was seen by [place] with Dr Lavera Guise as a part of collaborative care agreement  Meds ordered this encounter  Medications  . rosuvastatin (CRESTOR) 10 MG tablet    Sig: Take 1 tablet (10 mg total) by mouth daily.    Dispense:  90 tablet    Refill:  1    Order Specific Question:   Supervising Provider    Answer:   Lavera Guise [[id number]]  . carvedilol (COREG) 6.25 MG tablet    Sig: Take 1 tablet by mouth twice daily    Dispense:  180 tablet    Refill:  1    Pt need appt for further refills    Order Specific Question:   Supervising Provider    Answer:   Lavera Guise [place]  . furosemide (LASIX) 80 MG tablet    Sig: TAKE 1 TABLET BY MOUTH ONCE DAILY    Dispense:  90 tablet    Refill:  1    Order Specific Question:   Supervising Provider    Answer:   Lavera Guise [[id number]]    Total time spent: 30 Minutes   Time spent includes review of chart, medications, test results, and follow up plan  with the patient.      Dr Lavera Guise Internal medicine

## 2020-01-27 ENCOUNTER — Ambulatory Visit: Payer: Self-pay | Admitting: Nurse Practitioner

## 2020-03-26 ENCOUNTER — Emergency Department: Payer: Self-pay

## 2020-03-26 ENCOUNTER — Other Ambulatory Visit: Payer: Self-pay

## 2020-03-26 DIAGNOSIS — Z6841 Body Mass Index (BMI) 40.0 and over, adult: Secondary | ICD-10-CM

## 2020-03-26 DIAGNOSIS — Z79899 Other long term (current) drug therapy: Secondary | ICD-10-CM

## 2020-03-26 DIAGNOSIS — R509 Fever, unspecified: Secondary | ICD-10-CM | POA: Diagnosis present

## 2020-03-26 DIAGNOSIS — E785 Hyperlipidemia, unspecified: Secondary | ICD-10-CM | POA: Diagnosis present

## 2020-03-26 DIAGNOSIS — Z20822 Contact with and (suspected) exposure to covid-19: Secondary | ICD-10-CM | POA: Diagnosis present

## 2020-03-26 DIAGNOSIS — I313 Pericardial effusion (noninflammatory): Secondary | ICD-10-CM | POA: Diagnosis present

## 2020-03-26 DIAGNOSIS — E876 Hypokalemia: Secondary | ICD-10-CM | POA: Diagnosis present

## 2020-03-26 DIAGNOSIS — G4733 Obstructive sleep apnea (adult) (pediatric): Secondary | ICD-10-CM | POA: Diagnosis present

## 2020-03-26 DIAGNOSIS — R091 Pleurisy: Secondary | ICD-10-CM | POA: Diagnosis present

## 2020-03-26 DIAGNOSIS — Z713 Dietary counseling and surveillance: Secondary | ICD-10-CM

## 2020-03-26 DIAGNOSIS — R0602 Shortness of breath: Secondary | ICD-10-CM | POA: Diagnosis present

## 2020-03-26 DIAGNOSIS — I110 Hypertensive heart disease with heart failure: Principal | ICD-10-CM | POA: Diagnosis present

## 2020-03-26 DIAGNOSIS — K8020 Calculus of gallbladder without cholecystitis without obstruction: Secondary | ICD-10-CM | POA: Diagnosis present

## 2020-03-26 DIAGNOSIS — I878 Other specified disorders of veins: Secondary | ICD-10-CM | POA: Diagnosis present

## 2020-03-26 DIAGNOSIS — J9801 Acute bronchospasm: Secondary | ICD-10-CM | POA: Diagnosis present

## 2020-03-26 DIAGNOSIS — I5033 Acute on chronic diastolic (congestive) heart failure: Secondary | ICD-10-CM | POA: Diagnosis present

## 2020-03-26 DIAGNOSIS — I083 Combined rheumatic disorders of mitral, aortic and tricuspid valves: Secondary | ICD-10-CM | POA: Diagnosis present

## 2020-03-26 LAB — CBC
HCT: 36.4 % — ABNORMAL LOW (ref 39.0–52.0)
Hemoglobin: 12.4 g/dL — ABNORMAL LOW (ref 13.0–17.0)
MCH: 33 pg (ref 26.0–34.0)
MCHC: 34.1 g/dL (ref 30.0–36.0)
MCV: 96.8 fL (ref 80.0–100.0)
Platelets: 313 10*3/uL (ref 150–400)
RBC: 3.76 MIL/uL — ABNORMAL LOW (ref 4.22–5.81)
RDW: 12.3 % (ref 11.5–15.5)
WBC: 12.2 10*3/uL — ABNORMAL HIGH (ref 4.0–10.5)
nRBC: 0 % (ref 0.0–0.2)

## 2020-03-26 LAB — BASIC METABOLIC PANEL
Anion gap: 13 (ref 5–15)
BUN: 16 mg/dL (ref 6–20)
CO2: 26 mmol/L (ref 22–32)
Calcium: 8.6 mg/dL — ABNORMAL LOW (ref 8.9–10.3)
Chloride: 99 mmol/L (ref 98–111)
Creatinine, Ser: 0.91 mg/dL (ref 0.61–1.24)
GFR calc Af Amer: >60 mL/min (ref >60)
GFR calc non Af Amer: >60 mL/min (ref >60)
Glucose, Bld: 133 mg/dL — ABNORMAL HIGH (ref 70–99)
Potassium: 3.1 mmol/L — ABNORMAL LOW (ref 3.5–5.1)
Sodium: 138 mmol/L (ref 135–145)

## 2020-03-26 LAB — TROPONIN I (HIGH SENSITIVITY)
Troponin I (High Sensitivity): 28 ng/L — ABNORMAL HIGH (ref <18)
Troponin I (High Sensitivity): 31 ng/L — ABNORMAL HIGH (ref <18)

## 2020-03-26 MED ORDER — SODIUM CHLORIDE 0.9% FLUSH: 3.0000 mL | Freq: Once | INTRAVENOUS | Status: DC

## 2020-03-26 NOTE — ED Triage Notes (Addendum)
Pt arrives via POV for c/o shob, chest pain since Thursday. PT reports fever 102 yesterday, afebrile today. PT shob after walking from lobby to triage room. PT reports left sided chest pain as well.

## 2020-03-27 ENCOUNTER — Emergency Department: Payer: Self-pay

## 2020-03-27 ENCOUNTER — Other Ambulatory Visit: Payer: Self-pay

## 2020-03-27 ENCOUNTER — Inpatient Hospital Stay
Admit: 2020-03-27 | Discharge: 2020-03-27 | Disposition: A | Discharge status: Home / Self Care | Payer: Self-pay | Attending: Family Medicine | Admitting: Family Medicine

## 2020-03-27 ENCOUNTER — Inpatient Hospital Stay
Admission: EM | Admit: 2020-03-27 | Discharge: 2020-03-29 | DRG: 292 | Disposition: A | Discharge status: Home / Self Care | Payer: Self-pay | Attending: Internal Medicine | Admitting: Internal Medicine

## 2020-03-27 DIAGNOSIS — I10 Essential (primary) hypertension: Secondary | ICD-10-CM

## 2020-03-27 DIAGNOSIS — R1011 Right upper quadrant pain: Secondary | ICD-10-CM

## 2020-03-27 DIAGNOSIS — I5033 Acute on chronic diastolic (congestive) heart failure: Secondary | ICD-10-CM | POA: Diagnosis present

## 2020-03-27 DIAGNOSIS — E876 Hypokalemia: Secondary | ICD-10-CM

## 2020-03-27 DIAGNOSIS — Z9989 Dependence on other enabling machines and devices: Secondary | ICD-10-CM

## 2020-03-27 DIAGNOSIS — I509 Heart failure, unspecified: Secondary | ICD-10-CM

## 2020-03-27 DIAGNOSIS — I313 Pericardial effusion (noninflammatory): Secondary | ICD-10-CM

## 2020-03-27 DIAGNOSIS — I3139 Other pericardial effusion (noninflammatory): Secondary | ICD-10-CM

## 2020-03-27 DIAGNOSIS — R0602 Shortness of breath: Secondary | ICD-10-CM

## 2020-03-27 DIAGNOSIS — E66813 Obesity, class 3: Secondary | ICD-10-CM

## 2020-03-27 LAB — HIV ANTIBODY (ROUTINE TESTING W REFLEX): HIV Screen 4th Generation wRfx: NONREACTIVE

## 2020-03-27 LAB — MAGNESIUM: Magnesium: 2.1 mg/dL (ref 1.7–2.4)

## 2020-03-27 LAB — LACTIC ACID, PLASMA
Lactic Acid, Venous: 1.1 mmol/L (ref 0.5–1.9)
Lactic Acid, Venous: 0.9 mmol/L (ref 0.5–1.9)

## 2020-03-27 LAB — HEPATIC FUNCTION PANEL
ALT: 25 U/L (ref 0–44)
AST: 26 U/L (ref 15–41)
Albumin: 3.1 g/dL — ABNORMAL LOW (ref 3.5–5.0)
Alkaline Phosphatase: 106 U/L (ref 38–126)
Bilirubin, Direct: 0.3 mg/dL — ABNORMAL HIGH (ref 0.0–0.2)
Indirect Bilirubin: 0.5 mg/dL (ref 0.3–0.9)
Total Bilirubin: 0.8 mg/dL (ref 0.3–1.2)
Total Protein: 7.9 g/dL (ref 6.5–8.1)

## 2020-03-27 LAB — BRAIN NATRIURETIC PEPTIDE: B Natriuretic Peptide: 127.7 pg/mL — ABNORMAL HIGH (ref 0.0–100.0)

## 2020-03-27 LAB — TSH: TSH: 1.732 u[IU]/mL (ref 0.350–4.500)

## 2020-03-27 LAB — PROCALCITONIN: Procalcitonin: <0.1 ng/mL

## 2020-03-27 LAB — LIPASE, BLOOD: Lipase: 23 U/L (ref 11–51)

## 2020-03-27 LAB — SARS CORONAVIRUS 2 BY RT PCR (HOSPITAL ORDER, PERFORMED IN ~~LOC~~ HOSPITAL LAB): SARS Coronavirus 2: NEGATIVE

## 2020-03-27 LAB — ECHOCARDIOGRAM COMPLETE

## 2020-03-27 MED ORDER — SODIUM CHLORIDE 0.9% FLUSH
3.0000 mL | Freq: Two times a day (BID) | INTRAVENOUS | Status: DC
  Administered 2020-03-27 – 2020-03-29 (×5): 3 mL via INTRAVENOUS

## 2020-03-27 MED ORDER — ALBUTEROL SULFATE (2.5 MG/3ML) 0.083% IN NEBU: 2.5000 mg | INHALATION_SOLUTION | Freq: Four times a day (QID) | RESPIRATORY_TRACT | Status: DC | PRN

## 2020-03-27 MED ORDER — CARVEDILOL 6.25 MG PO TABS
6.2500 mg | ORAL_TABLET | Freq: Two times a day (BID) | ORAL | Status: DC
  Administered 2020-03-27 – 2020-03-28 (×4): 6.25 mg via ORAL
  Filled 2020-03-27 (×5): qty 1

## 2020-03-27 MED ORDER — ONDANSETRON HCL 4 MG/2ML IJ SOLN: 4.0000 mg | Freq: Four times a day (QID) | INTRAMUSCULAR | Status: DC | PRN

## 2020-03-27 MED ORDER — ALBUTEROL SULFATE (2.5 MG/3ML) 0.083% IN NEBU
2.5000 mg | INHALATION_SOLUTION | Freq: Once | RESPIRATORY_TRACT | Status: AC
  Administered 2020-03-27: 2.5 mg via RESPIRATORY_TRACT
  Filled 2020-03-27: qty 3

## 2020-03-27 MED ORDER — FUROSEMIDE 10 MG/ML IJ SOLN
40.0000 mg | Freq: Once | INTRAMUSCULAR | Status: AC
  Administered 2020-03-27: 40 mg via INTRAVENOUS
  Filled 2020-03-27: qty 4

## 2020-03-27 MED ORDER — ACETAMINOPHEN 325 MG PO TABS
650.0000 mg | ORAL_TABLET | ORAL | Status: DC | PRN
  Administered 2020-03-27: 650 mg via ORAL
  Filled 2020-03-27: qty 2

## 2020-03-27 MED ORDER — POTASSIUM CHLORIDE 20 MEQ PO PACK
40.0000 meq | PACK | Freq: Once | ORAL | Status: AC
  Administered 2020-03-27: 40 meq via ORAL
  Filled 2020-03-27: qty 2

## 2020-03-27 MED ORDER — ALPRAZOLAM 0.25 MG PO TABS: 0.2500 mg | ORAL_TABLET | Freq: Two times a day (BID) | ORAL | Status: DC | PRN

## 2020-03-27 MED ORDER — LISINOPRIL 10 MG PO TABS
10.0000 mg | ORAL_TABLET | Freq: Every day | ORAL | Status: DC
  Administered 2020-03-27 – 2020-03-28 (×2): 10 mg via ORAL
  Filled 2020-03-27 (×2): qty 1

## 2020-03-27 MED ORDER — SODIUM CHLORIDE 0.9 % IV SOLN: 250.0000 mL | INTRAVENOUS | Status: DC | PRN

## 2020-03-27 MED ORDER — SODIUM CHLORIDE 0.9 % IV SOLN
2.0000 g | INTRAVENOUS | Status: DC
  Administered 2020-03-27: 2 g via INTRAVENOUS
  Filled 2020-03-27: qty 20

## 2020-03-27 MED ORDER — POTASSIUM CHLORIDE CRYS ER 20 MEQ PO TBCR
40.0000 meq | EXTENDED_RELEASE_TABLET | Freq: Once | ORAL | Status: AC
  Administered 2020-03-27: 40 meq via ORAL
  Filled 2020-03-27: qty 2

## 2020-03-27 MED ORDER — ROSUVASTATIN CALCIUM 10 MG PO TABS
10.0000 mg | ORAL_TABLET | Freq: Every day | ORAL | Status: DC
  Administered 2020-03-27 – 2020-03-28 (×2): 10 mg via ORAL
  Filled 2020-03-27 (×3): qty 1

## 2020-03-27 MED ORDER — PERFLUTREN LIPID MICROSPHERE
1.0000 mL | INTRAVENOUS | Status: AC | PRN
  Administered 2020-03-27: 5 mL via INTRAVENOUS
  Filled 2020-03-27: qty 10

## 2020-03-27 MED ORDER — FUROSEMIDE 10 MG/ML IJ SOLN
40.0000 mg | Freq: Two times a day (BID) | INTRAMUSCULAR | Status: DC
  Administered 2020-03-27 – 2020-03-29 (×4): 40 mg via INTRAVENOUS
  Filled 2020-03-27 (×4): qty 4

## 2020-03-27 MED ORDER — GUAIFENESIN ER 600 MG PO TB12
600.0000 mg | ORAL_TABLET | Freq: Two times a day (BID) | ORAL | Status: DC
  Administered 2020-03-27 – 2020-03-29 (×5): 600 mg via ORAL
  Filled 2020-03-27 (×5): qty 1

## 2020-03-27 MED ORDER — ENOXAPARIN SODIUM 40 MG/0.4ML ~~LOC~~ SOLN
40.0000 mg | Freq: Two times a day (BID) | SUBCUTANEOUS | Status: DC
  Administered 2020-03-27 – 2020-03-28 (×3): 40 mg via SUBCUTANEOUS
  Filled 2020-03-27 (×4): qty 0.4

## 2020-03-27 MED ORDER — IPRATROPIUM-ALBUTEROL 0.5-2.5 (3) MG/3ML IN SOLN
3.0000 mL | Freq: Four times a day (QID) | RESPIRATORY_TRACT | Status: DC
  Administered 2020-03-27: 3 mL via RESPIRATORY_TRACT
  Filled 2020-03-27 (×2): qty 3

## 2020-03-27 MED ORDER — IPRATROPIUM-ALBUTEROL 0.5-2.5 (3) MG/3ML IN SOLN
3.0000 mL | RESPIRATORY_TRACT | Status: DC | PRN
  Administered 2020-03-28 – 2020-03-29 (×3): 3 mL via RESPIRATORY_TRACT
  Filled 2020-03-27 (×3): qty 3

## 2020-03-27 MED ORDER — IOHEXOL 350 MG/ML SOLN
100.0000 mL | Freq: Once | INTRAVENOUS | Status: AC | PRN
  Administered 2020-03-27: 100 mL via INTRAVENOUS

## 2020-03-27 MED ORDER — SODIUM CHLORIDE 0.9% FLUSH: 3.0000 mL | INTRAVENOUS | Status: DC | PRN

## 2020-03-27 MED ORDER — ZOLPIDEM TARTRATE 5 MG PO TABS: 5.0000 mg | ORAL_TABLET | Freq: Every evening | ORAL | Status: DC | PRN

## 2020-03-27 MED ORDER — HYDROCOD POLST-CPM POLST ER 10-8 MG/5ML PO SUER: 5.0000 mL | Freq: Two times a day (BID) | ORAL | Status: DC | PRN

## 2020-03-27 MED ORDER — ASPIRIN EC 81 MG PO TBEC
81.0000 mg | DELAYED_RELEASE_TABLET | Freq: Every day | ORAL | Status: DC
  Administered 2020-03-27 – 2020-03-29 (×3): 81 mg via ORAL
  Filled 2020-03-27 (×4): qty 1

## 2020-03-27 MED ORDER — MORPHINE SULFATE (PF) 2 MG/ML IV SOLN: 2.0000 mg | INTRAVENOUS | Status: DC | PRN

## 2020-03-27 NOTE — Progress Notes (Signed)
Pt admitted into rm 256, VSS, no complaints of pain. Pt stated he has a history of lymphedema and that he was driving for [employer] when he became short of breath and drove himself to the ED. Pts call bell within reach and understands how to call for help.

## 2020-03-27 NOTE — ED Provider Notes (Signed)
[facility] Emergency Department Provider Note   ____________________________________________   First MD Initiated Contact with Patient 03/27/20 0246     (approximate)  I have reviewed the triage vital signs and the nursing notes.   HISTORY  Chief Complaint Shortness of Breath and Pleurisy    HPI Bradley Velez is a 54 y.o. male who presents to the ED from home with a chief complaint of fever, shortness of breath and chest pain x2 days.  Patient reports temperature 102 F.  Last took ibuprofen approximately 5 AM.  Denies cough.  Complains of left-sided chest tightness.  Shortness of breath worsened on lying supine.  Denies abdominal pain, nausea, vomiting or diarrhea.  Denies recent travel, trauma or hormone use.  Denies known COVID-19 exposure.  Has had both vaccinations.       Past Medical History:  Diagnosis Date   Hyperlipidemia    Hypertension    Obesity    Sleep apnea     Patient Active Problem List   Diagnosis Date Noted   Acute on chronic diastolic CHF (congestive heart failure) ([place]) 03/27/2020   Congestive heart failure with left ventricular diastolic dysfunction, chronic ([place]) 04/02/2019   Pneumonia 10/28/2018   Screening for prostate cancer 04/29/2018   Mixed hyperlipidemia 04/29/2018   Cellulitis of lower extremity 04/29/2018   Dysuria 04/29/2018   Hypertension 11/26/2017   Sleep apnea 11/26/2017    Past Surgical History:  Procedure Laterality Date   EYE SURGERY Bilateral 1970    Prior to Admission medications   Medication Sig Start Date End Date Taking? Authorizing Provider  albuterol (VENTOLIN HFA) 108 (90 Base) MCG/ACT inhaler Inhale 2 puffs into the lungs every 6 (six) hours as needed for wheezing or shortness of breath. 01/15/19  Yes Scarboro, Audie Clear, NP  carvedilol (COREG) 6.25 MG tablet Take 1 tablet by mouth twice daily 01/26/20  Yes Boscia, Heather E, NP  furosemide (LASIX) 80 MG tablet TAKE 1 TABLET BY  MOUTH ONCE DAILY 01/26/20  Yes Boscia, Heather E, NP  rosuvastatin (CRESTOR) 10 MG tablet Take 1 tablet (10 mg total) by mouth daily. 01/26/20  Yes Ronnell Freshwater, NP    Allergies Patient has no known allergies.  Family History  Problem Relation Age of Onset   Emphysema Father    Lung cancer Father     Social History Social History   Tobacco Use   Smoking status: Never Smoker   Smokeless tobacco: Never Used  Substance Use Topics   Alcohol use: Yes    Comment: social   Drug use: No    Review of Systems  Constitutional: Positive for fever. Eyes: No visual changes. ENT: No sore throat. Cardiovascular: Denies chest pain. Respiratory: Positive for shortness of breath. Gastrointestinal: No abdominal pain.  No nausea, no vomiting.  No diarrhea.  No constipation. Genitourinary: Negative for dysuria. Musculoskeletal: Negative for back pain. Skin: Negative for rash. Neurological: Negative for headaches, focal weakness or numbness.   ____________________________________________   PHYSICAL EXAM:  VITAL SIGNS: ED Triage Vitals  Enc Vitals Group     BP 03/26/20 1919 122/86     Pulse Rate 03/26/20 1919 95     Resp 03/26/20 1919 18     Temp 03/26/20 1919 99.8 F (37.7 C)     Temp Source 03/26/20 1919 Oral     SpO2 03/26/20 1919 95 %     Weight 03/26/20 1920 (!) 315 lb (142.9 kg)     Height 03/26/20 1920 5\' 6"  (1.676  m)     Head Circumference --      Peak Flow --      Pain Score 03/26/20 1920 0     Pain Loc --      Pain Edu? --      Excl. in GC? --     Constitutional: Alert and oriented. Well appearing and in mild acute distress. Eyes: Conjunctivae are normal. PERRL. EOMI. Head: Atraumatic. Nose: No congestion/rhinnorhea. Mouth/Throat: Mucous membranes are moist.   Neck: No stridor.   Cardiovascular: Normal rate, regular rhythm. Grossly normal heart sounds.  Good peripheral circulation. Respiratory: Increased respiratory effort.  No retractions. Lungs  diminished with rales bibasilarly. Gastrointestinal: Obese.  Soft and nontender. No distention. No abdominal bruits. No CVA tenderness. Musculoskeletal: No lower extremity tenderness.  Baseline BLE lymphedema.  No joint effusions. Neurologic:  Normal speech and language. No gross focal neurologic deficits are appreciated. No gait instability. Skin:  Skin is warm, dry and intact. No rash noted. Psychiatric: Mood and affect are normal. Speech and behavior are normal.  ____________________________________________   LABS (all labs ordered are listed, but only abnormal results are displayed)  Labs Reviewed  BASIC METABOLIC PANEL - Abnormal; Notable for the following components:      Result Value   Potassium 3.1 (*)    Glucose, Bld 133 (*)    Calcium 8.6 (*)    All other components within normal limits  CBC - Abnormal; Notable for the following components:   WBC 12.2 (*)    RBC 3.76 (*)    Hemoglobin 12.4 (*)    HCT 36.4 (*)    All other components within normal limits  BRAIN NATRIURETIC PEPTIDE - Abnormal; Notable for the following components:   B Natriuretic Peptide 127.7 (*)    All other components within normal limits  HEPATIC FUNCTION PANEL - Abnormal; Notable for the following components:   Albumin 3.1 (*)    Bilirubin, Direct 0.3 (*)    All other components within normal limits  TROPONIN I (HIGH SENSITIVITY) - Abnormal; Notable for the following components:   Troponin I (High Sensitivity) 28 (*)    All other components within normal limits  TROPONIN I (HIGH SENSITIVITY) - Abnormal; Notable for the following components:   Troponin I (High Sensitivity) 31 (*)    All other components within normal limits  CULTURE, BLOOD (ROUTINE X 2)  CULTURE, BLOOD (ROUTINE X 2)  SARS CORONAVIRUS 2 BY RT PCR (HOSPITAL ORDER, PERFORMED IN [place] [facility])  LACTIC ACID, PLASMA  PROCALCITONIN  LIPASE, BLOOD  LACTIC ACID, PLASMA  MAGNESIUM  HIV ANTIBODY (ROUTINE TESTING W REFLEX)    TSH   ____________________________________________  EKG  ED ECG REPORT I, Lavonya Hoerner J, the attending physician, personally viewed and interpreted this ECG.   Date: 03/27/2020  EKG Time: 1922  Rate: 100  Rhythm: normal EKG, normal sinus rhythm  Axis: Normal  Intervals:none  ST&T Change: Nonspecific  ____________________________________________  RADIOLOGY  ED MD interpretation: Cardiomegaly, pulmonary vascular congestion, bilateral pleural effusions, left basilar airspace disease; CT demonstrates no PE, no pneumonia, moderate pericardial effusion  Official radiology report(s): DG Chest 2 View  Result Date: 03/26/2020 CLINICAL DATA:  Shortness of breath EXAM: CHEST - 2 VIEW COMPARISON:  10/28/2018 FINDINGS: There are small bilateral pleural effusions. Cardiomegaly with vascular congestion. Airspace disease at the left base. No pneumothorax. IMPRESSION: 1. Cardiomegaly with vascular congestion 2. Small bilateral pleural effusion with left basilar airspace disease. Electronically Signed   By: Adrian Prows.D.  On: 03/26/2020 20:14   CT Angio Chest PE W/Cm &/Or Wo Cm  Result Date: 03/27/2020 CLINICAL DATA:  Short of breath with chest pain for 2 days. Fever yesterday. EXAM: CT ANGIOGRAPHY CHEST WITH CONTRAST TECHNIQUE: Multidetector CT imaging of the chest was performed using the standard protocol during bolus administration of intravenous contrast. Multiplanar CT image reconstructions and MIPs were obtained to evaluate the vascular anatomy. CONTRAST:  OMNIPAQUE IOHEXOL 350 MG/ML SOLN COMPARISON:  10/28/2018 FINDINGS: Cardiovascular: There is satisfactory opacification of the central pulmonary arteries. There is somewhat suboptimal opacification of the segmental and smaller vessels peripherally limiting assessment for smaller pulmonary emboli. Allowing for this, there is no evidence of a pulmonary embolism. Heart is normal in size. There is a moderate pericardial effusion which is new  since the prior CT. Mild three-vessel coronary artery calcifications. Great vessels are normal in caliber. No aortic dissection or atherosclerosis. Mediastinum/Nodes: No mediastinal or hilar masses. Visualized thyroid is unremarkable. There are several prominent mediastinal lymph nodes, largest a right paratracheal node measuring 11 mm in short axis. Trachea and esophagus are unremarkable. Lungs/Pleura: Small bilateral pleural effusions. Mild bilateral interstitial thickening accentuated by low lung volumes. There are scattered calcified granuloma most evident in the right lower lobe. Mild dependent atelectasis also noted in the lower lobes. No convincing pneumonia. No pneumothorax. Upper Abdomen: No acute abnormality. Musculoskeletal: No fracture or acute finding. No bone lesion. No chest wall mass. Review of the MIP images confirms the above findings. IMPRESSION: 1. No evidence of a pulmonary embolism. 2. Moderate pericardial effusion with small bilateral pleural effusions. Mild interstitial thickening accentuated by low lung volumes. Interstitial thickening suggests mild interstitial edema. No airspace edema and no evidence of pneumonia. Electronically Signed   By: Amie Portland M.D.   On: 03/27/2020 05:23    ____________________________________________   PROCEDURES  Procedure(s) performed (including Critical Care):  .1-3 Lead EKG Interpretation Performed by: Irean Hong, MD Authorized by: Irean Hong, MD     Interpretation: normal     ECG rate:  88   ECG rate assessment: normal     Rhythm: sinus rhythm     Ectopy: none     Conduction: normal   Comments:     Patient placed on cardiac monitor to evaluate for arrhythmias     ____________________________________________   INITIAL IMPRESSION / ASSESSMENT AND PLAN / ED COURSE  As part of my medical decision making, I reviewed the following data within the electronic MEDICAL RECORD NUMBER Nursing notes reviewed and incorporated, Labs  reviewed, EKG interpreted, Old chart reviewed, Radiograph reviewed and Notes from prior ED visits     Jaydis Duchene was evaluated in Emergency Department on 03/27/2020 for the symptoms described in the history of present illness. He was evaluated in the context of the global COVID-19 pandemic, which necessitated consideration that the patient might be at risk for infection with the SARS-CoV-2 virus that causes COVID-19. Institutional protocols and algorithms that pertain to the evaluation of patients at risk for COVID-19 are in a state of rapid change based on information released by regulatory bodies including the CDC and federal and state organizations. These policies and algorithms were followed during the patient's care in the ED.    54 year old male presenting with fever, chest pain and shortness of breath Differential includes, but is not limited to, viral syndrome, bronchitis including COPD exacerbation, pneumonia, reactive airway disease including asthma, CHF including exacerbation with or without pulmonary/interstitial edema, pneumothorax, ACS, thoracic trauma, and pulmonary embolism.  Laboratory results demonstrate mild leukocytosis, mildly elevated troponin x2, mild hypokalemia.  Chest x-ray remarkable for vascular congestion, questionable left-sided pneumonia.  Will obtain blood cultures, lactic acid, procalcitonin.  Check BMP.  Diurese with IV Lasix.  Check COVID-19.  Proceed with CT chest to evaluate for PE.  Will also ambulate patient on pulse oximeter.   Clinical Course as of Mar 27 705  Sat Mar 27, 2020  0501 Patient ambulated with pulse oximeter.  No hypoxia.  Patient became very tachypneic with a respiratory rate in the 30s.  Awaiting results of CT scan   [JS]  0548 Case discussed with hospital services.  Patient stated to the hospital as he was having right upper quadrant abdominal pain.  Will check LFTs/lipase and obtain right upper quadrant abdominal ultrasound to evaluate for  cholecystitis.   [JS]    Clinical Course User Index [JS] Irean Hong, MD     ____________________________________________   FINAL CLINICAL IMPRESSION(S) / ED DIAGNOSES  Final diagnoses:  Shortness of breath  Acute on chronic congestive heart failure, unspecified heart failure type (HCC)  Pericardial effusion  Hypokalemia     ED Discharge Orders    None       Note:  This document was prepared using Dragon voice recognition software and may include unintentional dictation errors.   Irean Hong, MD 03/27/20 [phone]

## 2020-03-27 NOTE — ED Notes (Signed)
Report given to Pattricia Boss, RN on 2A

## 2020-03-27 NOTE — H&P (Signed)
[place] at [facility]   PATIENT NAME: Bradley Velez    MR#:  [id number]  DATE OF BIRTH:  [birth year]  DATE OF ADMISSION:  03/27/2020  PRIMARY CARE PHYSICIAN: Carlean Jews, NP   REQUESTING/REFERRING PHYSICIAN: Starr Sinclair, MD  CHIEF COMPLAINT:   Chief Complaint  Patient presents with  . Shortness of Breath  . Pleurisy    HISTORY OF PRESENT ILLNESS:  Bradley Velez  is a 54 y.o. Caucasian male with a known history of diastolic CHF, hypertension, dyslipidemia and obstructive sleep apnea with obesity, presented to the emergency room with acute onset of worsening dyspnea over the last couple of days with associated cough that has been mainly dry with wheezing and paroxysmal nocturnal dyspnea with lower extremity edema which has not been worsening on left lower extremity lymphedema..  He admitted to fever that was up to 102 and came down as well as associated chills.  He had his second dose of Pfizer COVID-19 vaccine on 01/27/2020.  He denied any diarrhea or constipation however admitted to right upper quadrant or epigastric abdominal pain with tenderness.  It has been extending across the abdomen.  No dysuria, oliguria or hematuria or flank pain.  Upon presentation to the emergency room, temperature was 99.8 with otherwise normal vital signs.  Upon ambulation however his respiratory rate was up to 33.  Labs revealed hypokalemia of 3.1 with otherwise unremarkable BMP.  BNP was 127.7 and high-sensitivity troponin I was 28 and later 31.  Lactic acid was 1.1 and procalcitonin less than 0.1.  CBC showed leukocytosis of 12.2 as well as anemia slightly worse than previous levels in January of this year. COVID-19 PCR came back negative. Blood cultures were drawn.  Chest x-ray showed bilateral small pleural effusions and left basal airspace disease however chest CTA revealed no evidence for PE and showed moderate pericardial effusion with small bilateral pleural effusions and mild  interstitial thickening accentuated by low lung volumes and suggesting mild interstitial edema with no evidence for pneumonia.  The patient was given 40 mg of IV Lasix as well as nebulized albuterol.  He will be admitted to a cardiac progressive unit bed for further evaluation and management.  PAST MEDICAL HISTORY:   Past Medical History:  Diagnosis Date  . Hyperlipidemia   . Hypertension   . Obesity   . Sleep apnea   Diastolic CHF  PAST SURGICAL HISTORY:   Past Surgical History:  Procedure Laterality Date  . EYE SURGERY Bilateral 1970    SOCIAL HISTORY:   Social History   Tobacco Use  . Smoking status: Never Smoker  . Smokeless tobacco: Never Used  Substance Use Topics  . Alcohol use: Yes    Comment: social    FAMILY HISTORY:   Family History  Problem Relation Age of Onset  . Emphysema Father   . Lung cancer Father     DRUG ALLERGIES:  No Known Allergies  REVIEW OF SYSTEMS:   ROS As per history of present illness. All pertinent systems were reviewed above. Constitutional,  HEENT, cardiovascular, respiratory, GI, GU, musculoskeletal, neuro, psychiatric, endocrine,  integumentary and hematologic systems were reviewed and are otherwise  negative/unremarkable except for positive findings mentioned above in the HPI.   MEDICATIONS AT HOME:   Prior to Admission medications   Medication Sig Start Date End Date Taking? Authorizing Provider  albuterol (VENTOLIN HFA) 108 (90 Base) MCG/ACT inhaler Inhale 2 puffs into the lungs every 6 (six) hours as needed for wheezing or  shortness of breath. 01/15/19  Yes Scarboro, Coralee North, NP  carvedilol (COREG) 6.25 MG tablet Take 1 tablet by mouth twice daily 01/26/20  Yes Boscia, Heather E, NP  furosemide (LASIX) 80 MG tablet TAKE 1 TABLET BY MOUTH ONCE DAILY 01/26/20  Yes Boscia, Heather E, NP  rosuvastatin (CRESTOR) 10 MG tablet Take 1 tablet (10 mg total) by mouth daily. 01/26/20  Yes Boscia, Heather E, NP      VITAL SIGNS:    Blood pressure (!) 120/95, pulse 93, temperature 99.8 F (37.7 C), temperature source Oral, resp. rate (!) 22, height 5\' 6"  (1.676 m), weight (!) 142.9 kg, SpO2 95 %.  PHYSICAL EXAMINATION:  Physical Exam  GENERAL:  54 y.o.-year-old patient lying in the bed with minimal conversational dyspnea.   EYES: Pupils equal, round, reactive to light and accommodation. No scleral icterus. Extraocular muscles intact.  HEENT: Head atraumatic, normocephalic. Oropharynx and nasopharynx clear.  NECK:  Supple, no jugular venous distention. No thyroid enlargement, no tenderness.  LUNGS: Diminished bibasal and left midlung zone breath sounds with fine bibasilar rales. CARDIOVASCULAR: Regular rate and rhythm, S1, S2 normal. No murmurs, rubs, or gallops.  ABDOMEN: Soft, nondistended with right upper quadrant epigastric tenderness and questionably positive Murphy sign with no rebound tenderness guarding or rigidity.  Bowel sounds present. No organomegaly or mass.  EXTREMITIES: Bilateral lower extremity lymphedema and 1-2+ pitting edema with no cyanosis, or clubbing.  NEUROLOGIC: Cranial nerves II through XII are intact. Muscle strength 5/5 in all extremities. Sensation intact. Gait not checked.  PSYCHIATRIC: The patient is alert and oriented x 3.  Normal affect and good eye contact. SKIN: No obvious rash, lesion, or ulcer.   LABORATORY PANEL:   CBC Recent Labs  Lab 03/26/20 1934  WBC 12.2*  HGB 12.4*  HCT 36.4*  PLT 313   ------------------------------------------------------------------------------------------------------------------  Chemistries  Recent Labs  Lab 03/26/20 1934  NA 138  K 3.1*  CL 99  CO2 26  GLUCOSE 133*  BUN 16  CREATININE 0.91  CALCIUM 8.6*   ------------------------------------------------------------------------------------------------------------------  Cardiac Enzymes No results for input(s): TROPONINI in the last 168  hours. ------------------------------------------------------------------------------------------------------------------  RADIOLOGY:  DG Chest 2 View  Result Date: 03/26/2020 CLINICAL DATA:  Shortness of breath EXAM: CHEST - 2 VIEW COMPARISON:  10/28/2018 FINDINGS: There are small bilateral pleural effusions. Cardiomegaly with vascular congestion. Airspace disease at the left base. No pneumothorax. IMPRESSION: 1. Cardiomegaly with vascular congestion 2. Small bilateral pleural effusion with left basilar airspace disease. Electronically Signed   By: 12/27/2018 M.D.   On: 03/26/2020 20:14   CT Angio Chest PE W/Cm &/Or Wo Cm  Result Date: 03/27/2020 CLINICAL DATA:  Short of breath with chest pain for 2 days. Fever yesterday. EXAM: CT ANGIOGRAPHY CHEST WITH CONTRAST TECHNIQUE: Multidetector CT imaging of the chest was performed using the standard protocol during bolus administration of intravenous contrast. Multiplanar CT image reconstructions and MIPs were obtained to evaluate the vascular anatomy. CONTRAST:  05/27/2020 OMNIPAQUE IOHEXOL 350 MG/ML SOLN COMPARISON:  10/28/2018 FINDINGS: Cardiovascular: There is satisfactory opacification of the central pulmonary arteries. There is somewhat suboptimal opacification of the segmental and smaller vessels peripherally limiting assessment for smaller pulmonary emboli. Allowing for this, there is no evidence of a pulmonary embolism. Heart is normal in size. There is a moderate pericardial effusion which is new since the prior CT. Mild three-vessel coronary artery calcifications. Great vessels are normal in caliber. No aortic dissection or atherosclerosis. Mediastinum/Nodes: No mediastinal or hilar masses. Visualized thyroid is  unremarkable. There are several prominent mediastinal lymph nodes, largest a right paratracheal node measuring 11 mm in short axis. Trachea and esophagus are unremarkable. Lungs/Pleura: Small bilateral pleural effusions. Mild bilateral  interstitial thickening accentuated by low lung volumes. There are scattered calcified granuloma most evident in the right lower lobe. Mild dependent atelectasis also noted in the lower lobes. No convincing pneumonia. No pneumothorax. Upper Abdomen: No acute abnormality. Musculoskeletal: No fracture or acute finding. No bone lesion. No chest wall mass. Review of the MIP images confirms the above findings. IMPRESSION: 1. No evidence of a pulmonary embolism. 2. Moderate pericardial effusion with small bilateral pleural effusions. Mild interstitial thickening accentuated by low lung volumes. Interstitial thickening suggests mild interstitial edema. No airspace edema and no evidence of pneumonia. Electronically Signed   By: Lajean Manes M.D.   On: 03/27/2020 05:23      IMPRESSION AND PLAN:  1.  Acute on chronic diastolic CHF. -The patient will be admitted to a progressive unit bed. -He will be diuresed with IV Lasix. -We will follow serial troponin I's as well as TSH and magnesium level -Obtain a cardiology consultation and a 2D echo. -I notified Dr. Ubaldo Glassing about the patient.  2.  Fever with associated right upper quadrant abdominal pain and tenderness.  He also has cough and wheezing though there was no clear evidence of pneumonia on his chest CTA. -The patient was empirically placed on IV Rocephin after having blood cultures. -We will obtain right upper quadrant ultrasound serum lipase level, both currently pending. -If there is evidence of cholecystitis he will need surgery consult. -Pain management to be provided. -Mucolytic therapy will be provided as well as as needed duo nebs.  3.  Hypokalemia. -Potassium will be replaced and magnesium level will be checked.  4.  Hypertension. -We will continue Coreg and Cozaar.  5.  Dyslipidemia. -We will continue statin therapy and Lovaza.  6.  Obstructive sleep apnea. -He will be continued on CPAP nightly.  7.  DVT prophylaxis. -Subcutaneous  Lovenox.   All the records are reviewed and case discussed with ED provider. The plan of care was discussed in details with the patient (and family). I answered all questions. The patient agreed to proceed with the above mentioned plan. Further management will depend upon hospital course.   CODE STATUS: Full code  Status is: Inpatient  Remains inpatient appropriate because:Ongoing active pain requiring inpatient pain management, Ongoing diagnostic testing needed not appropriate for outpatient work up, Unsafe d/c plan, IV treatments appropriate due to intensity of illness or inability to take PO and Inpatient level of care appropriate due to severity of illness   Dispo: The patient is from: Home              Anticipated d/c is to: Home              Anticipated d/c date is: 2 days              Patient currently is not medically stable to d/c.   TOTAL TIME TAKING CARE OF THIS PATIENT: 55 minutes.    Christel Mormon M.D on 03/27/2020 at 6:25 AM  [facility]   From 7 PM-7 AM, contact night-coverage [email or web address]  CC: Primary care physician; Ronnell Freshwater, NP   Note: This dictation was prepared with Dragon dictation along with smaller phrase technology. Any transcriptional errors that result from this process are unintentional.

## 2020-03-27 NOTE — Consult Note (Signed)
Cardiology Consultation Note    Patient ID: Bradley Velez, MRN: [id number], DOB/AGE: January 01, 1966 54 y.o. Admit date: 03/27/2020   Date of Consult: 03/27/2020 Primary Physician: Carlean Jews, NP Primary Cardiologist: Dr. Gwen Pounds  Chief Complaint: sob Reason for Consultation: Dante Gang Requesting MD: Dr. Arville Care  HPI: Bradley Velez is a 54 y.o. male with history of congestive heart failure with ejection fraction by echo in 2016 showing ejection fraction of 40% with mild AR moderate MR and TR, hypertension, dyslipidemia, obstructive sleep apnea with obesity who presented to the emergency room with complaints of increasing dyspnea over the last several days including lower extremity edema which is chronic.  He states he had a fever of 102 with associated chills.  He received a second 150 COVID-19 vaccine on January 27, 2020.  Is afebrile on presentation.  He had hypokalemia with a K of 3.1 and a BNP of 127 with high-sensitivity troponin XX 05/2030.  COVID-19 PCR was negative.  Blood cultures are pending.  Chest x-ray showed small bilateral pleural effusions with a left basal airspace disease.  CT of the chest showed no pulmonary embolus.  There was felt on CT to have pericardial effusion.  Echocardiogram is pending.  Patient apparently was on Crestor 10 mg daily, furosemide 80 mg daily, carvedilol 6.25 mg twice daily as well as Ventolin rescue inhaler as an outpatient.  Blood pressure was controlled on presentation.  EKG sinus rhythm with nonspecific ST-T wave changes.  Based on the aforementioned issues he was admitted and placed on nebulizer therapy, empiric antibiotics awaiting cultures, enteric-coated aspirin 81 mg daily, remained on carvedilol 6.25 mg twice daily and placed on IV Lasix 40 mg every 12 hours.  Patient states that he eats a fairly high sodium diet although is attempting to reduce added salt does eat in fast food restaurants frequently.  Is intermittently compliant with his CPAP.  States he has had  shortness of breath gradually increasing but dramatically worsening over the last 2 days.  He reports compliance with his medications.  Echocardiogram done during this admission reveals an EF around 30%.  There is trivial pericardial effusion with no hemodynamic compromise.  Past Medical History:  Diagnosis Date  . Hyperlipidemia   . Hypertension   . Obesity   . Sleep apnea       Surgical History:  Past Surgical History:  Procedure Laterality Date  . EYE SURGERY Bilateral 1970     Home Meds: Prior to Admission medications   Medication Sig Start Date End Date Taking? Authorizing Provider  albuterol (VENTOLIN HFA) 108 (90 Base) MCG/ACT inhaler Inhale 2 puffs into the lungs every 6 (six) hours as needed for wheezing or shortness of breath. 01/15/19  Yes Scar[place], Coralee North, NP  carvedilol (COREG) 6.25 MG tablet Take 1 tablet by mouth twice daily 01/26/20  Yes Boscia, Heather E, NP  furosemide (LASIX) 80 MG tablet TAKE 1 TABLET BY MOUTH ONCE DAILY 01/26/20  Yes Boscia, Heather E, NP  rosuvastatin (CRESTOR) 10 MG tablet Take 1 tablet (10 mg total) by mouth daily. 01/26/20  Yes Carlean Jews, NP    Inpatient Medications:  . aspirin EC  81 mg Oral Daily  . carvedilol  6.25 mg Oral BID WC  . enoxaparin (LOVENOX) injection  40 mg Subcutaneous BID  . furosemide  40 mg Intravenous Q12H  . guaiFENesin  600 mg Oral BID  . ipratropium-albuterol  3 mL Nebulization QID  . potassium chloride  40 mEq Oral Once  .  rosuvastatin  10 mg Oral Daily  . sodium chloride flush  3 mL Intravenous Once  . sodium chloride flush  3 mL Intravenous Q12H   . sodium chloride    . cefTRIAXone (ROCEPHIN)  IV 2 g (03/27/20 0701)    Allergies: No Known Allergies  Social History   Socioeconomic History  . Marital status: Married    Spouse name: Not on file  . Number of children: Not on file  . Years of education: Not on file  . Highest education level: Not on file  Occupational History  . Not on file  Tobacco  Use  . Smoking status: Never Smoker  . Smokeless tobacco: Never Used  Substance and Sexual Activity  . Alcohol use: Yes    Comment: social  . Drug use: No  . Sexual activity: Not on file  Other Topics Concern  . Not on file  Social History Narrative  . Not on file   Social Determinants of Health   Financial Resource Strain:   . Difficulty of Paying Living Expenses:   Food Insecurity:   . Worried About Programme researcher, broadcasting/film/video in the Last Year:   . Barista in the Last Year:   Transportation Needs:   . Freight forwarder (Medical):   Marland Kitchen Lack of Transportation (Non-Medical):   Physical Activity:   . Days of Exercise per Week:   . Minutes of Exercise per Session:   Stress:   . Feeling of Stress :   Social Connections:   . Frequency of Communication with Friends and Family:   . Frequency of Social Gatherings with Friends and Family:   . Attends Religious Services:   . Active Member of Clubs or Organizations:   . Attends Banker Meetings:   Marland Kitchen Marital Status:   Intimate Partner Violence:   . Fear of Current or Ex-Partner:   . Emotionally Abused:   Marland Kitchen Physically Abused:   . Sexually Abused:      Family History  Problem Relation Age of Onset  . Emphysema Father   . Lung cancer Father      Review of Systems: A 12-system review of systems was performed and is negative except as noted in the HPI.  Labs: No results for input(s): CKTOTAL, CKMB, TROPONINI in the last 72 hours. Lab Results  Component Value Date   WBC 12.2 (H) 03/26/2020   HGB 12.4 (L) 03/26/2020   HCT 36.4 (L) 03/26/2020   MCV 96.8 03/26/2020   PLT 313 03/26/2020    Recent Labs  Lab 03/26/20 1934 03/27/20 0319  NA 138  --   K 3.1*  --   CL 99  --   CO2 26  --   BUN 16  --   CREATININE 0.91  --   CALCIUM 8.6*  --   PROT  --  7.9  BILITOT  --  0.8  ALKPHOS  --  106  ALT  --  25  AST  --  26  GLUCOSE 133*  --    Lab Results  Component Value Date   CHOL 117 10/02/2013    HDL 29 (L) 10/02/2013   LDLCALC 38 10/02/2013   TRIG 248 (H) 10/02/2013   No results found for: DDIMER  Radiology/Studies:  DG Chest 2 View  Result Date: 03/26/2020 CLINICAL DATA:  Shortness of breath EXAM: CHEST - 2 VIEW COMPARISON:  10/28/2018 FINDINGS: There are small bilateral pleural effusions. Cardiomegaly with vascular congestion. Airspace disease at  the left base. No pneumothorax. IMPRESSION: 1. Cardiomegaly with vascular congestion 2. Small bilateral pleural effusion with left basilar airspace disease. Electronically Signed   By: Donavan Foil M.D.   On: 03/26/2020 20:14   CT Angio Chest PE W/Cm &/Or Wo Cm  Result Date: 03/27/2020 CLINICAL DATA:  Short of breath with chest pain for 2 days. Fever yesterday. EXAM: CT ANGIOGRAPHY CHEST WITH CONTRAST TECHNIQUE: Multidetector CT imaging of the chest was performed using the standard protocol during bolus administration of intravenous contrast. Multiplanar CT image reconstructions and MIPs were obtained to evaluate the vascular anatomy. CONTRAST:  17mL OMNIPAQUE IOHEXOL 350 MG/ML SOLN COMPARISON:  10/28/2018 FINDINGS: Cardiovascular: There is satisfactory opacification of the central pulmonary arteries. There is somewhat suboptimal opacification of the segmental and smaller vessels peripherally limiting assessment for smaller pulmonary emboli. Allowing for this, there is no evidence of a pulmonary embolism. Heart is normal in size. There is a moderate pericardial effusion which is new since the prior CT. Mild three-vessel coronary artery calcifications. Great vessels are normal in caliber. No aortic dissection or atherosclerosis. Mediastinum/Nodes: No mediastinal or hilar masses. Visualized thyroid is unremarkable. There are several prominent mediastinal lymph nodes, largest a right paratracheal node measuring 11 mm in short axis. Trachea and esophagus are unremarkable. Lungs/Pleura: Small bilateral pleural effusions. Mild bilateral interstitial  thickening accentuated by low lung volumes. There are scattered calcified granuloma most evident in the right lower lobe. Mild dependent atelectasis also noted in the lower lobes. No convincing pneumonia. No pneumothorax. Upper Abdomen: No acute abnormality. Musculoskeletal: No fracture or acute finding. No bone lesion. No chest wall mass. Review of the MIP images confirms the above findings. IMPRESSION: 1. No evidence of a pulmonary embolism. 2. Moderate pericardial effusion with small bilateral pleural effusions. Mild interstitial thickening accentuated by low lung volumes. Interstitial thickening suggests mild interstitial edema. No airspace edema and no evidence of pneumonia. Electronically Signed   By: Lajean Manes M.D.   On: 03/27/2020 05:23    Wt Readings from Last 3 Encounters:  03/26/20 (!) 142.9 kg  01/26/20 (!) 142.6 kg  03/28/19 (!) 148.3 kg    EKG: Normal sinus rhythm with nonspecific ST-T wave changes  Physical Exam: Obese Caucasian male in no acute distress complaining of shortness of breath however Blood pressure 119/83, pulse 91, temperature 99.8 F (37.7 C), temperature source Oral, resp. rate 20, height 5\' 6"  (1.676 m), weight (!) 142.9 kg, SpO2 94 %. Body mass index is 50.84 kg/m. General: Well developed, well nourished, in no acute distress. Head: Normocephalic, atraumatic, sclera non-icteric, no xanthomas, nares are without discharge.  Neck: Negative for carotid bruits. JVD not elevated. Lungs: Distant breath sounds.  No wheezes.  Crackles bilaterally. Heart: RRR with S1 S2. No murmurs, rubs, or gallops appreciated. Abdomen: Soft, non-tender, non-distended with normoactive bowel sounds. No hepatomegaly. No rebound/guarding. No obvious abdominal masses. Msk:  Strength and tone appear normal for age. Extremities: No clubbing or cyanosis. No edema.  Distal pedal pulses are 2+ and equal bilaterally. Neuro: Alert and oriented X 3. No facial asymmetry. No focal deficit. Moves  all extremities spontaneously. Psych:  Responds to questions appropriately with a normal affect.     Assessment and Plan  54 year old male with history of heart failure, obesity, sleep apnea, known ejection fraction of 40 % with moderate AR and MR who presented to the emergency room with complaints of shortness of breath which had acutely exacerbated over the last several days.  Have been present for some  time previously.  As an outpatient he was last seen by cardiology in 2016.  As mentioned above, ejection fraction at that time was around 40% with global hypokinesis.  He has been treated with Crestor at 10 mg daily, furosemide 80 mg daily, carvedilol 6.25 mg twice daily as well as a Ventolin rescue inhaler.  He is intermittently compliant with CPAP.  1.  Congestive heart failure-EF appears to have dropped from 40% to 30% in the last 5 to 6 years.  Does not appear that this drop appears acute although intermittent data is not available.  He appears to have ruled out for myocardial infarction.  His BNP is only minimally elevated at 127.  He is Covid negative.  He denies chest pain.  He does have a small pericardial effusion however this does not show any evidence of hemodynamic compromise.  CT showed no pulmonary embolus.  Will continue with carvedilol.  Will carefully use IV Lasix and follow hemodynamics, electrolytes and renal function.  We will replete his potassium.  Will attempt to add afterload reduction following his blood pressure.  Will attempt lisinopril initially.  Sherryll Burger could be considered as an outpatient after stability has been achieved.  Will attempt to optimize his medical therapy with the addition of spironolactone as tolerated.  This will also help his serum potassium.  Further evaluation regarding the etiology of his change in ejection fraction can be carried out as an outpatient after optimization of his fluid status and hemodynamics has occurred.  Low-sodium diet and daily weights  were stressed.  2.  Sleep apnea-weight loss is imperative.  Also compliance daily with CPAP was again discussed and is imperative to his wellbeing.  We will further discuss this during hospitalization and as an outpatient.  3.  Hyperlipidemia-continue with Crestor 10 mg daily.  Outpatient follow-up of his lipid profile can be carried out.  4.  Bronchospasm-continue with Ventolin rescue inhaler.  Patient is on empiric antibiotic therapy.  Can continue with this for now.  Signed, Dalia Heading MD 03/27/2020, 7:16 AM Pager: [phone]

## 2020-03-27 NOTE — ED Notes (Signed)
Echo at bedside

## 2020-03-27 NOTE — Progress Notes (Signed)
[facility]  - [place] [facility]   PATIENT NAME: Bradley Velez    MR#:  [id number]  DATE OF BIRTH:  [birth year]  SUBJECTIVE:  patient just got up from the emergency room. No respiratory distress. Mainly has exertional shortness of breath. Sats 93 to 95% on room air. Denies chest pain. 3 o'clock.  REVIEW OF SYSTEMS:   Review of Systems  Constitutional: Negative for chills, fever and weight loss.  HENT: Negative for ear discharge, ear pain and nosebleeds.   Eyes: Negative for blurred vision, pain and discharge.  Respiratory: Positive for shortness of breath. Negative for sputum production, wheezing and stridor.   Cardiovascular: Positive for leg swelling. Negative for chest pain, palpitations, orthopnea and PND.  Gastrointestinal: Negative for abdominal pain, diarrhea, nausea and vomiting.  Genitourinary: Negative for frequency and urgency.  Musculoskeletal: Negative for back pain and joint pain.  Neurological: Negative for sensory change, speech change, focal weakness and weakness.  Psychiatric/Behavioral: Negative for depression and hallucinations. The patient is not nervous/anxious.    Tolerating Diet:yes tolerating PT: ambulatory  DRUG ALLERGIES:  No Known Allergies  VITALS:  Blood pressure 114/82, pulse 80, temperature 98.8 F (37.1 C), temperature source Oral, resp. rate 20, height 5\' 6"  (1.676 m), weight (!) 140.2 kg, SpO2 93 %.  PHYSICAL EXAMINATION:   Physical Exam  GENERAL:  54 y.o.-year-old patient lying in the bed with no acute distress. Morbid Obesity+ EYES: Pupils equal, round, reactive to light and accommodation. No scleral icterus.   HEENT: Head atraumatic, normocephalic. Oropharynx and nasopharynx clear.  NECK:  Supple, no jugular venous distention. No thyroid enlargement, no tenderness.  LUNGS: Normal breath sounds bilaterally, no wheezing, rales, rhonchi. No use of accessory muscles of respiration.  CARDIOVASCULAR: S1, S2 normal. No  murmurs, rubs, or gallops.  ABDOMEN: Soft, nontender, nondistended. Bowel sounds present. No organomegaly or mass.  EXTREMITIES: LE lymphedema L >right --chronic venous stasis with skin hyperpigmentation NEUROLOGIC: Cranial nerves II through XII are intact. No focal Motor or sensory deficits b/l.   PSYCHIATRIC:  patient is alert and oriented x 3.  SKIN: No obvious rash, lesion, or ulcer.   LABORATORY PANEL:  CBC Recent Labs  Lab 03/26/20 1934  WBC 12.2*  HGB 12.4*  HCT 36.4*  PLT 313    Chemistries  Recent Labs  Lab 03/26/20 1934 03/27/20 0319  NA 138  --   K 3.1*  --   CL 99  --   CO2 26  --   GLUCOSE 133*  --   BUN 16  --   CREATININE 0.91  --   CALCIUM 8.6*  --   MG  --  2.1  AST  --  26  ALT  --  25  ALKPHOS  --  106  BILITOT  --  0.8   Cardiac Enzymes No results for input(s): TROPONINI in the last 168 hours. RADIOLOGY:  DG Chest 2 View  Result Date: 03/26/2020 CLINICAL DATA:  Shortness of breath EXAM: CHEST - 2 VIEW COMPARISON:  10/28/2018 FINDINGS: There are small bilateral pleural effusions. Cardiomegaly with vascular congestion. Airspace disease at the left base. No pneumothorax. IMPRESSION: 1. Cardiomegaly with vascular congestion 2. Small bilateral pleural effusion with left basilar airspace disease. Electronically Signed   By: Jasmine PangKim  Fujinaga M.D.   On: 03/26/2020 20:14   CT Angio Chest PE W/Cm &/Or Wo Cm  Result Date: 03/27/2020 CLINICAL DATA:  Short of breath with chest pain for 2 days. Fever yesterday. EXAM: CT ANGIOGRAPHY CHEST  WITH CONTRAST TECHNIQUE: Multidetector CT imaging of the chest was performed using the standard protocol during bolus administration of intravenous contrast. Multiplanar CT image reconstructions and MIPs were obtained to evaluate the vascular anatomy. CONTRAST:  156mL OMNIPAQUE IOHEXOL 350 MG/ML SOLN COMPARISON:  10/28/2018 FINDINGS: Cardiovascular: There is satisfactory opacification of the central pulmonary arteries. There is somewhat  suboptimal opacification of the segmental and smaller vessels peripherally limiting assessment for smaller pulmonary emboli. Allowing for this, there is no evidence of a pulmonary embolism. Heart is normal in size. There is a moderate pericardial effusion which is new since the prior CT. Mild three-vessel coronary artery calcifications. Great vessels are normal in caliber. No aortic dissection or atherosclerosis. Mediastinum/Nodes: No mediastinal or hilar masses. Visualized thyroid is unremarkable. There are several prominent mediastinal lymph nodes, largest a right paratracheal node measuring 11 mm in short axis. Trachea and esophagus are unremarkable. Lungs/Pleura: Small bilateral pleural effusions. Mild bilateral interstitial thickening accentuated by low lung volumes. There are scattered calcified granuloma most evident in the right lower lobe. Mild dependent atelectasis also noted in the lower lobes. No convincing pneumonia. No pneumothorax. Upper Abdomen: No acute abnormality. Musculoskeletal: No fracture or acute finding. No bone lesion. No chest wall mass. Review of the MIP images confirms the above findings. IMPRESSION: 1. No evidence of a pulmonary embolism. 2. Moderate pericardial effusion with small bilateral pleural effusions. Mild interstitial thickening accentuated by low lung volumes. Interstitial thickening suggests mild interstitial edema. No airspace edema and no evidence of pneumonia. Electronically Signed   By: Lajean Manes M.D.   On: 03/27/2020 05:23   ECHOCARDIOGRAM COMPLETE  Result Date: 03/27/2020    ECHOCARDIOGRAM REPORT   Patient Name:   Bradley Velez Date of Exam: 03/27/2020 Medical Rec #:  [id number]     Height:       66.0 in Accession #:    [id number]    Weight:       315.0 lb Date of Birth:  [birth year]     BSA:          2.426 m Patient Age:    68 years      BP:           120/95 mmHg Patient Gender: M             HR:           93 bpm. Exam Location:  [facility] Procedure: 2D Echo and  Intracardiac Opacification Agent Indications:     Acute Diastolic CHF  History:         Patient has no prior history of Echocardiogram examinations.                  CHF; Risk Factors:Hypertension, Dyslipidemia, Sleep Apnea and                  Morbid Obesity.  Sonographer:     L Thornton-Maynard Referring Phys:  [id number] Arvella Merles MANSY Diagnosing Phys: Bartholome Bill MD  Sonographer Comments: Patient is morbidly obese. Image acquisition challenging due to patient body habitus. IMPRESSIONS  1. Left ventricular ejection fraction, by estimation, is 35 to 40%. Left ventricular ejection fraction by PLAX is 39 %. The left ventricle has mildly decreased function. The left ventricle has no regional wall motion abnormalities. There is mild left ventricular hypertrophy. Left ventricular diastolic parameters are consistent with Grade I diastolic dysfunction (impaired relaxation).  2. Right ventricular systolic function is normal. The right ventricular size is normal. There is normal pulmonary artery  systolic pressure.  3. The mitral valve was not well visualized. No evidence of mitral valve regurgitation.  4. The aortic valve was not well visualized. Aortic valve regurgitation is trivial. FINDINGS  Left Ventricle: Left ventricular ejection fraction, by estimation, is 35 to 40%. Left ventricular ejection fraction by PLAX is 39 %. The left ventricle has mildly decreased function. The left ventricle has no regional wall motion abnormalities. Definity  contrast agent was given IV to delineate the left ventricular endocardial borders. The left ventricular internal cavity size was normal in size. There is mild left ventricular hypertrophy. Left ventricular diastolic parameters are consistent with Grade I diastolic dysfunction (impaired relaxation). Right Ventricle: The right ventricular size is normal. No increase in right ventricular wall thickness. Right ventricular systolic function is normal. There is normal pulmonary artery systolic  pressure. The tricuspid regurgitant velocity is 2.17 m/s, and  with an assumed right atrial pressure of 10 mmHg, the estimated right ventricular systolic pressure is 28.8 mmHg. Left Atrium: Left atrial size was normal in size. Right Atrium: Right atrial size was normal in size. Pericardium: Trivial pericardial effusion is present. There is no evidence of cardiac tamponade. Mitral Valve: The mitral valve was not well visualized. No evidence of mitral valve regurgitation. MV peak gradient, 3.9 mmHg. The mean mitral valve gradient is 2.0 mmHg. Tricuspid Valve: The tricuspid valve is not well visualized. Tricuspid valve regurgitation is mild. Aortic Valve: The aortic valve was not well visualized. Aortic valve regurgitation is trivial. Aortic valve mean gradient measures 13.0 mmHg. Aortic valve peak gradient measures 17.5 mmHg. Aortic valve area, by VTI measures 1.98 cm. Pulmonic Valve: The pulmonic valve was not well visualized. Pulmonic valve regurgitation is not visualized. Aorta: The aortic root was not well visualized. IAS/Shunts: The interatrial septum was not assessed.  LEFT VENTRICLE PLAX 2D LV EF:         Left            Diastology                ventricular     LV e' lateral:   3.92 cm/s                ejection        LV E/e' lateral: 18.7                fraction by     LV e' medial:    7.07 cm/s                PLAX is 39      LV E/e' medial:  10.4                %. LVIDd:         5.65 cm LVIDs:         4.58 cm LV PW:         1.62 cm LV IVS:        1.99 cm LVOT diam:     3.00 cm LV SV:         71 LV SV Index:   29 LVOT Area:     7.07 cm  RIGHT VENTRICLE RV S prime:     10.00 cm/s TAPSE (M-mode): 2.5 cm LEFT ATRIUM             Index LA diam:        2.80 cm 1.15 cm/m LA Vol (A2C):   35.1 ml 14.47 ml/m LA Vol (A4C):   39.0  ml 16.08 ml/m LA Biplane Vol: 36.5 ml 15.05 ml/m  AORTIC VALVE AV Area (Vmax):    2.23 cm AV Area (Vmean):   2.20 cm AV Area (VTI):     1.98 cm AV Vmax:           209.00 cm/s AV Vmean:           173.000 cm/s AV VTI:            0.357 m AV Peak Grad:      17.5 mmHg AV Mean Grad:      13.0 mmHg LVOT Vmax:         65.90 cm/s LVOT Vmean:        53.900 cm/s LVOT VTI:          0.100 m LVOT/AV VTI ratio: 0.28  AORTA Ao Root diam: 4.20 cm MITRAL VALVE               TRICUSPID VALVE MV Peak grad: 3.9 mmHg     TR Peak grad:   18.8 mmHg MV Mean grad: 2.0 mmHg     TR Vmax:        217.00 cm/s MV Vmax:      0.99 m/s MV Vmean:     60.4 cm/s    SHUNTS MV E velocity: 73.30 cm/s  Systemic VTI:  0.10 m MV A velocity: 79.30 cm/s  Systemic Diam: 3.00 cm MV E/A ratio:  0.92 Harold Hedge MD Electronically signed by Harold Hedge MD Signature Date/Time: 03/27/2020/10:13:35 AM    Final    US ABDOMEN LIMITED RUQ  Result Date: 03/27/2020 CLINICAL DATA:  Epigastric/right upper quadrant pain EXAM: ULTRASOUND ABDOMEN LIMITED RIGHT UPPER QUADRANT COMPARISON:  None. FINDINGS: Gallbladder: Shadowing gallstone. Prominent gallbladder wall thickness but under distended. No pericholecystic edema or reported focal tenderness. Common bile duct: Diameter: 4 mm Liver: Echogenic liver with diminished acoustic penetration and pericholecystic sparing. No evidence of focal lesion. Portal vein is patent on color Doppler imaging with normal direction of blood flow towards the liver. IMPRESSION: 1. Cholelithiasis without evidence of cholecystitis. 2. Hepatic steatosis. Electronically Signed   By: Marnee Spring M.D.   On: 03/27/2020 07:22   ASSESSMENT AND PLAN:  Bradley Velez  is a 54 y.o. Caucasian male with a known history of diastolic CHF, hypertension, dyslipidemia and obstructive sleep apnea with obesity, presented to the emergency room with acute onset of worsening dyspnea over the last couple of days with associated cough that has been mainly dry with wheezing and paroxysmal nocturnal dyspnea with lower extremity edema which has not been worsening on left lower extremity lymphedema  1.  Acute on chronic diastolic CHF. - IV Lasix. 40  mg bid TSH and magnesium level -BNP 127--marginal increase - Dr. Lady Gary to see patient  2.  Fever with associated right upper quadrant abdominal pain and tenderness.  He also has cough and wheezing though there was no clear evidence of pneumonia on his chest CTA. -Pro calcitonin negative. No fever. White count 12.8. Patient is denies any abdominal pain. -Workup for infection negative. -Ultrasound abdomen shows gallstones. No gallbladder wall thickness. -BC negative -d/c rocephin  3.  Hypokalemia. -Potassium will be replaced and magnesium is 2.1  4.  Hypertension. - continue Coreg and Cozaar.  5.  Dyslipidemia. - continue statin therapy and Lovaza.  6.  Obstructive sleep apnea. -He will be continued on CPAP nightly.  7.  DVT prophylaxis. -Subcutaneous Lovenox.    CODE STATUS: Full code  Status is: Inpatient  Treatment for CHF  Dispo: The patient is from: Home  Anticipated d/c is to: Home  Anticipated d/c date is: 1-22 days  Patient currently is not medically stable to d/c.    Procedures:none Family communication :none Consults :Cardiolgoy Dr Lady Gary CODE STATUS: FULL DVT Prophylaxis :Lovenox  TOTAL TIME TAKING CARE OF THIS PATIENT: *35* minutes.  >50% time spent on counselling and coordination of care  Note: This dictation was prepared with Dragon dictation along with smaller phrase technology. Any transcriptional errors that result from this process are unintentional.  Enedina Finner M.D    [facility]   CC: Primary care physician; Carlean Jews, NPPatient ID: Bradley Ax, male   DOB: [birth year], 54 y.o.   MRN: [id number]

## 2020-03-27 NOTE — Plan of Care (Signed)

## 2020-03-27 NOTE — ED Notes (Signed)
Ambulated pt per MD request. PT o2 remained above 93%, however pt RR up to approx 30 and pt visibly winded.  PT safely back in bed at this time and provided shasta.

## 2020-03-28 DIAGNOSIS — I313 Pericardial effusion (noninflammatory): Secondary | ICD-10-CM

## 2020-03-28 DIAGNOSIS — G4733 Obstructive sleep apnea (adult) (pediatric): Secondary | ICD-10-CM

## 2020-03-28 DIAGNOSIS — Z9989 Dependence on other enabling machines and devices: Secondary | ICD-10-CM

## 2020-03-28 DIAGNOSIS — I3139 Other pericardial effusion (noninflammatory): Secondary | ICD-10-CM

## 2020-03-28 LAB — BASIC METABOLIC PANEL
Anion gap: 14 (ref 5–15)
BUN: 17 mg/dL (ref 6–20)
CO2: 30 mmol/L (ref 22–32)
Calcium: 8.6 mg/dL — ABNORMAL LOW (ref 8.9–10.3)
Chloride: 96 mmol/L — ABNORMAL LOW (ref 98–111)
Creatinine, Ser: 0.88 mg/dL (ref 0.61–1.24)
GFR calc Af Amer: >60 mL/min (ref >60)
GFR calc non Af Amer: >60 mL/min (ref >60)
Glucose, Bld: 135 mg/dL — ABNORMAL HIGH (ref 70–99)
Potassium: 2.9 mmol/L — ABNORMAL LOW (ref 3.5–5.1)
Sodium: 140 mmol/L (ref 135–145)

## 2020-03-28 LAB — CBC WITH DIFFERENTIAL/PLATELET
Abs Immature Granulocytes: 0.04 10*3/uL (ref 0.00–0.07)
Basophils Absolute: 0.1 10*3/uL (ref 0.0–0.1)
Basophils Relative: 1 %
Eosinophils Absolute: 0.3 10*3/uL (ref 0.0–0.5)
Eosinophils Relative: 3 %
HCT: 35 % — ABNORMAL LOW (ref 39.0–52.0)
Hemoglobin: 11.8 g/dL — ABNORMAL LOW (ref 13.0–17.0)
Immature Granulocytes: 0 %
Lymphocytes Relative: 18 %
Lymphs Abs: 1.8 10*3/uL (ref 0.7–4.0)
MCH: 32.4 pg (ref 26.0–34.0)
MCHC: 33.7 g/dL (ref 30.0–36.0)
MCV: 96.2 fL (ref 80.0–100.0)
Monocytes Absolute: 1.3 10*3/uL — ABNORMAL HIGH (ref 0.1–1.0)
Monocytes Relative: 13 %
Neutro Abs: 6.2 10*3/uL (ref 1.7–7.7)
Neutrophils Relative %: 65 %
Platelets: 369 10*3/uL (ref 150–400)
RBC: 3.64 MIL/uL — ABNORMAL LOW (ref 4.22–5.81)
RDW: 12.6 % (ref 11.5–15.5)
WBC: 9.7 10*3/uL (ref 4.0–10.5)
nRBC: 0 % (ref 0.0–0.2)

## 2020-03-28 LAB — POTASSIUM: Potassium: 3.6 mmol/L (ref 3.5–5.1)

## 2020-03-28 LAB — MAGNESIUM: Magnesium: 2 mg/dL (ref 1.7–2.4)

## 2020-03-28 MED ORDER — POTASSIUM CHLORIDE CRYS ER 20 MEQ PO TBCR
40.0000 meq | EXTENDED_RELEASE_TABLET | Freq: Once | ORAL | Status: AC
  Administered 2020-03-28: 40 meq via ORAL
  Filled 2020-03-28: qty 2

## 2020-03-28 MED ORDER — POTASSIUM CHLORIDE 20 MEQ PO PACK
40.0000 meq | PACK | ORAL | Status: DC
  Administered 2020-03-28: 40 meq via ORAL
  Filled 2020-03-28: qty 2

## 2020-03-28 NOTE — Consult Note (Signed)
PHARMACY CONSULT NOTE - FOLLOW UP  Pharmacy Consult for Electrolyte Monitoring and Replacement   Recent Labs: Potassium (mmol/L)  Date Value  03/28/2020 2.9 (L)  03/08/2014 3.5   Magnesium (mg/dL)  Date Value  93/55/2174 2.0   Calcium (mg/dL)  Date Value  71/59/5396 8.6 (L)   Calcium, Total (mg/dL)  Date Value  72/89/7915 8.1 (L)   Albumin (g/dL)  Date Value  02/02/6437 3.1 (L)  03/06/2014 2.6 (L)   Sodium (mmol/L)  Date Value  03/28/2020 140  03/08/2014 135 (L)   Assessment: Pharmacy consulted for electrolyte monitoring and replacement 54 yo male admitted with acute on chronic CHF and fever.   Patient is also currently receiving furosemide 40mg  IV every 12 hours and lisinopril 10mg .   Goal of Therapy:  Electrolytes WNL  Plan:  6/6 AM: K:2.9- Will order KCL every 4 hours x 2 doses.  Will recheck K+ this evening @ 1800. Pharmacy will continue to monitor and order replacement as needed.   , PharmD, BCPS Clinical Pharmacist 03/28/2020 9:02 AM

## 2020-03-28 NOTE — Progress Notes (Signed)
[facility] [place] NAME: Bradley Velez    MR#:  [id number]  DATE OF BIRTH:  Jan 02, 1966  SUBJECTIVE:  patient just got up from the emergency room. No respiratory distress. Mainly has exertional shortness of breath. Sats 93 to 95% on room air. Denies chest pain.   REVIEW OF SYSTEMS:   Review of Systems  Constitutional: Negative for chills, fever and weight loss.  HENT: Negative for ear discharge, ear pain and nosebleeds.   Eyes: Negative for blurred vision, pain and discharge.  Respiratory: Positive for shortness of breath. Negative for sputum production, wheezing and stridor.   Cardiovascular: Positive for leg swelling. Negative for chest pain, palpitations, orthopnea and PND.  Gastrointestinal: Negative for abdominal pain, diarrhea, nausea and vomiting.  Genitourinary: Negative for frequency and urgency.  Musculoskeletal: Negative for back pain and joint pain.  Neurological: Negative for sensory change, speech change, focal weakness and weakness.  Psychiatric/Behavioral: Negative for depression and hallucinations. The patient is not nervous/anxious.    Tolerating Diet:yes tolerating PT: ambulatory  DRUG ALLERGIES:  No Known Allergies  VITALS:  Blood pressure 108/68, pulse 75, temperature 98.5 F (36.9 C), temperature source Oral, resp. rate 18, height 5\' 6"  (1.676 m), weight (!) 140 kg, SpO2 93 %.  PHYSICAL EXAMINATION:   Physical Exam  GENERAL:  54 y.o.-year-old patient lying in the bed with no acute distress. Morbid Obesity+ EYES: Pupils equal, round, reactive to light and accommodation. No scleral icterus.   HEENT: Head atraumatic, normocephalic. Oropharynx and nasopharynx clear.  NECK:  Supple, no jugular venous distention. No thyroid enlargement, no tenderness.  LUNGS: Normal breath sounds bilaterally, no wheezing, rales, rhonchi. No use of accessory muscles of respiration.  CARDIOVASCULAR: S1, S2 normal. No murmurs, rubs, or  gallops.  ABDOMEN: Soft, nontender, nondistended. Bowel sounds present. No organomegaly or mass.  EXTREMITIES: LE lymphedema L >right --chronic venous stasis with skin hyperpigmentation NEUROLOGIC: Cranial nerves II through XII are intact. No focal Motor or sensory deficits b/l.   PSYCHIATRIC:  patient is alert and oriented x 3.  SKIN: No obvious rash, lesion, or ulcer.   LABORATORY PANEL:  CBC Recent Labs  Lab 03/28/20 0705  WBC 9.7  HGB 11.8*  HCT 35.0*  PLT 369    Chemistries  Recent Labs  Lab 03/26/20 1934 03/27/20 0319 03/28/20 0705  NA   < >  --  140  K   < >  --  2.9*  CL   < >  --  96*  CO2   < >  --  30  GLUCOSE   < >  --  135*  BUN   < >  --  17  CREATININE   < >  --  0.88  CALCIUM   < >  --  8.6*  MG   < > 2.1 2.0  AST  --  26  --   ALT  --  25  --   ALKPHOS  --  106  --   BILITOT  --  0.8  --    < > = values in this interval not displayed.   Cardiac Enzymes No results for input(s): TROPONINI in the last 168 hours. RADIOLOGY:  DG Chest 2 View  Result Date: 03/26/2020 CLINICAL DATA:  Shortness of breath EXAM: CHEST - 2 VIEW COMPARISON:  10/28/2018 FINDINGS: There are small bilateral pleural effusions. Cardiomegaly with vascular congestion. Airspace disease at the left base. No pneumothorax. IMPRESSION: 1. Cardiomegaly with  vascular congestion 2. Small bilateral pleural effusion with left basilar airspace disease. Electronically Signed   By: Jasmine PangKim  Fujinaga M.D.   On: 03/26/2020 20:14   CT Angio Chest PE W/Cm &/Or Wo Cm  Result Date: 03/27/2020 CLINICAL DATA:  Short of breath with chest pain for 2 days. Fever yesterday. EXAM: CT ANGIOGRAPHY CHEST WITH CONTRAST TECHNIQUE: Multidetector CT imaging of the chest was performed using the standard protocol during bolus administration of intravenous contrast. Multiplanar CT image reconstructions and MIPs were obtained to evaluate the vascular anatomy. CONTRAST:  100mL OMNIPAQUE IOHEXOL 350 MG/ML SOLN COMPARISON:   10/28/2018 FINDINGS: Cardiovascular: There is satisfactory opacification of the central pulmonary arteries. There is somewhat suboptimal opacification of the segmental and smaller vessels peripherally limiting assessment for smaller pulmonary emboli. Allowing for this, there is no evidence of a pulmonary embolism. Heart is normal in size. There is a moderate pericardial effusion which is new since the prior CT. Mild three-vessel coronary artery calcifications. Great vessels are normal in caliber. No aortic dissection or atherosclerosis. Mediastinum/Nodes: No mediastinal or hilar masses. Visualized thyroid is unremarkable. There are several prominent mediastinal lymph nodes, largest a right paratracheal node measuring 11 mm in short axis. Trachea and esophagus are unremarkable. Lungs/Pleura: Small bilateral pleural effusions. Mild bilateral interstitial thickening accentuated by low lung volumes. There are scattered calcified granuloma most evident in the right lower lobe. Mild dependent atelectasis also noted in the lower lobes. No convincing pneumonia. No pneumothorax. Upper Abdomen: No acute abnormality. Musculoskeletal: No fracture or acute finding. No bone lesion. No chest wall mass. Review of the MIP images confirms the above findings. IMPRESSION: 1. No evidence of a pulmonary embolism. 2. Moderate pericardial effusion with small bilateral pleural effusions. Mild interstitial thickening accentuated by low lung volumes. Interstitial thickening suggests mild interstitial edema. No airspace edema and no evidence of pneumonia. Electronically Signed   By: Amie Portlandavid  Ormond M.D.   On: 03/27/2020 05:23   ECHOCARDIOGRAM COMPLETE  Result Date: 03/27/2020    ECHOCARDIOGRAM REPORT   Patient Name:   Bradley AxNTHONY Moder Date of Exam: 03/27/2020 Medical Rec #:  [id number]     Height:       66.0 in Accession #:    [id number]    Weight:       315.0 lb Date of Birth:  1965-12-26     BSA:          2.426 m Patient Age:    54 years      BP:            120/95 mmHg Patient Gender: M             HR:           93 bpm. Exam Location:  [facility] Procedure: 2D Echo and Intracardiac Opacification Agent Indications:     Acute Diastolic CHF  History:         Patient has no prior history of Echocardiogram examinations.                  CHF; Risk Factors:Hypertension, Dyslipidemia, Sleep Apnea and                  Morbid Obesity.  Sonographer:     L Thornton-Maynard Referring Phys:  [id number] Vernetta HoneyJAN A MANSY Diagnosing Phys: Harold HedgeKenneth Fath MD  Sonographer Comments: Patient is morbidly obese. Image acquisition challenging due to patient body habitus. IMPRESSIONS  1. Left ventricular ejection fraction, by estimation, is 35 to 40%. Left ventricular ejection fraction by  PLAX is 39 %. The left ventricle has mildly decreased function. The left ventricle has no regional wall motion abnormalities. There is mild left ventricular hypertrophy. Left ventricular diastolic parameters are consistent with Grade I diastolic dysfunction (impaired relaxation).  2. Right ventricular systolic function is normal. The right ventricular size is normal. There is normal pulmonary artery systolic pressure.  3. The mitral valve was not well visualized. No evidence of mitral valve regurgitation.  4. The aortic valve was not well visualized. Aortic valve regurgitation is trivial. FINDINGS  Left Ventricle: Left ventricular ejection fraction, by estimation, is 35 to 40%. Left ventricular ejection fraction by PLAX is 39 %. The left ventricle has mildly decreased function. The left ventricle has no regional wall motion abnormalities. Definity  contrast agent was given IV to delineate the left ventricular endocardial borders. The left ventricular internal cavity size was normal in size. There is mild left ventricular hypertrophy. Left ventricular diastolic parameters are consistent with Grade I diastolic dysfunction (impaired relaxation). Right Ventricle: The right ventricular size is normal. No increase in  right ventricular wall thickness. Right ventricular systolic function is normal. There is normal pulmonary artery systolic pressure. The tricuspid regurgitant velocity is 2.17 m/s, and  with an assumed right atrial pressure of 10 mmHg, the estimated right ventricular systolic pressure is 28.8 mmHg. Left Atrium: Left atrial size was normal in size. Right Atrium: Right atrial size was normal in size. Pericardium: Trivial pericardial effusion is present. There is no evidence of cardiac tamponade. Mitral Valve: The mitral valve was not well visualized. No evidence of mitral valve regurgitation. MV peak gradient, 3.9 mmHg. The mean mitral valve gradient is 2.0 mmHg. Tricuspid Valve: The tricuspid valve is not well visualized. Tricuspid valve regurgitation is mild. Aortic Valve: The aortic valve was not well visualized. Aortic valve regurgitation is trivial. Aortic valve mean gradient measures 13.0 mmHg. Aortic valve peak gradient measures 17.5 mmHg. Aortic valve area, by VTI measures 1.98 cm. Pulmonic Valve: The pulmonic valve was not well visualized. Pulmonic valve regurgitation is not visualized. Aorta: The aortic root was not well visualized. IAS/Shunts: The interatrial septum was not assessed.  LEFT VENTRICLE PLAX 2D LV EF:         Left            Diastology                ventricular     LV e' lateral:   3.92 cm/s                ejection        LV E/e' lateral: 18.7                fraction by     LV e' medial:    7.07 cm/s                PLAX is 39      LV E/e' medial:  10.4                %. LVIDd:         5.65 cm LVIDs:         4.58 cm LV PW:         1.62 cm LV IVS:        1.99 cm LVOT diam:     3.00 cm LV SV:         71 LV SV Index:   29 LVOT Area:     7.07 cm  RIGHT VENTRICLE RV S prime:     10.00 cm/s TAPSE (M-mode): 2.5 cm LEFT ATRIUM             Index LA diam:        2.80 cm 1.15 cm/m LA Vol (A2C):   35.1 ml 14.47 ml/m LA Vol (A4C):   39.0 ml 16.08 ml/m LA Biplane Vol: 36.5 ml 15.05 ml/m  AORTIC VALVE  AV Area (Vmax):    2.23 cm AV Area (Vmean):   2.20 cm AV Area (VTI):     1.98 cm AV Vmax:           209.00 cm/s AV Vmean:          173.000 cm/s AV VTI:            0.357 m AV Peak Grad:      17.5 mmHg AV Mean Grad:      13.0 mmHg LVOT Vmax:         65.90 cm/s LVOT Vmean:        53.900 cm/s LVOT VTI:          0.100 m LVOT/AV VTI ratio: 0.28  AORTA Ao Root diam: 4.20 cm MITRAL VALVE               TRICUSPID VALVE MV Peak grad: 3.9 mmHg     TR Peak grad:   18.8 mmHg MV Mean grad: 2.0 mmHg     TR Vmax:        217.00 cm/s MV Vmax:      0.99 m/s MV Vmean:     60.4 cm/s    SHUNTS MV E velocity: 73.30 cm/s  Systemic VTI:  0.10 m MV A velocity: 79.30 cm/s  Systemic Diam: 3.00 cm MV E/A ratio:  0.92 Harold Hedge MD Electronically signed by Harold Hedge MD Signature Date/Time: 03/27/2020/10:13:35 AM    Final    US ABDOMEN LIMITED RUQ  Result Date: 03/27/2020 CLINICAL DATA:  Epigastric/right upper quadrant pain EXAM: ULTRASOUND ABDOMEN LIMITED RIGHT UPPER QUADRANT COMPARISON:  None. FINDINGS: Gallbladder: Shadowing gallstone. Prominent gallbladder wall thickness but under distended. No pericholecystic edema or reported focal tenderness. Common bile duct: Diameter: 4 mm Liver: Echogenic liver with diminished acoustic penetration and pericholecystic sparing. No evidence of focal lesion. Portal vein is patent on color Doppler imaging with normal direction of blood flow towards the liver. IMPRESSION: 1. Cholelithiasis without evidence of cholecystitis. 2. Hepatic steatosis. Electronically Signed   By: Marnee Spring M.D.   On: 03/27/2020 07:22   ASSESSMENT AND PLAN:  Bradley Velez  is a 54 y.o. Caucasian male with a known history of diastolic CHF, hypertension, dyslipidemia and obstructive sleep apnea with obesity, presented to the emergency room with acute onset of worsening dyspnea over the last couple of days with associated cough that has been mainly dry with wheezing and paroxysmal nocturnal dyspnea with lower extremity  edema which has not been worsening on left lower extremity lymphedema  1.  Acute on chronic diastolic CHF. - IV Lasix. 40 mg bid--good uop TSH and magnesium level -BNP 127--marginal increase - Dr. Lady Gary input appreciated. Continue cardiac meds. Not much room to increase dosage given soft blood pressure.  2.  Fever with associated right upper quadrant abdominal pain and tenderness.  He also has cough and wheezing though there was no clear evidence of pneumonia on his chest CTA-- resolved -Pro calcitonin negative. No fever. White count 12.8. Patient is denies any abdominal pain. -Workup for infection negative. -Ultrasound abdomen  shows gallstones. No gallbladder wall thickness. -BC negative -d/c rocephin  3.  Hypokalemia. -Potassium will be replaced and magnesium is 2.1  4.  Hypertension. - continue Coreg and Cozaar.  5.  Dyslipidemia. - continue statin therapy and Lovaza.  6.  Obstructive sleep apnea. -He will be continued on CPAP nightly.  7.  DVT prophylaxis. -Subcutaneous Lovenox.    CODE STATUS: Full code  Status is: Inpatient  Treatment for CHF  Dispo: The patient is from: Home  Anticipated d/c is to: Home  Anticipated d/c date is: 1-2 days  Patient currently is not medically stable to d/c. Cardiology needs to optimize his cardiac meds and to ensure he tolerated well  Procedures:none Family communication :none Consults :Cardiolgoy Dr Lady Gary CODE STATUS: FULL DVT Prophylaxis :Lovenox  TOTAL TIME TAKING CARE OF THIS PATIENT: *25* minutes.  >50% time spent on counselling and coordination of care  Note: This dictation was prepared with Dragon dictation along with smaller phrase technology. Any transcriptional errors that result from this process are unintentional.  Enedina Finner M.D    [facility]   CC: Primary care physician; Carlean Jews, NPPatient ID: Bradley Velez, male   DOB: [birth year], 54 y.o.    MRN: [id number]

## 2020-03-28 NOTE — Progress Notes (Signed)
Patient Name: Bradley Velez Date of Encounter: 03/28/2020  Hospital Problem List     Active Problems:   Acute on chronic diastolic CHF (congestive heart failure) (HCC)   Hypokalemia   Obesity, Class III, BMI 40-49.9 (morbid obesity) (HCC)   Pericardial effusion   OSA on CPAP    Patient Profile    54 y.o. male with history of congestive heart failure with ejection fraction by echo in 2016 showing ejection fraction of 40% with mild AR moderate MR and TR, hypertension, dyslipidemia, obstructive sleep apnea with obesity who presented to the emergency room with complaints of increasing dyspnea over the last several days including lower extremity edema which is chronic.  He states he had a fever of 102 with associated chills.  He received a second 150 COVID-19 vaccine on January 27, 2020.  Is afebrile on presentation.  He had hypokalemia with a K of 3.1 and a BNP of 127 with high-sensitivity troponin XX 05/2030.  COVID-19 PCR was negative.  Blood cultures are pending.  Chest x-ray showed small bilateral pleural effusions with a left basal airspace disease.  CT of the chest showed no pulmonary embolus.  There was felt on CT to have pericardial effusion.  Echocardiogram is pending.  Patient apparently was on Crestor 10 mg daily, furosemide 80 mg daily, carvedilol 6.25 mg twice daily as well as Ventolin rescue inhaler as an outpatient.  Blood pressure was controlled on presentation.  EKG sinus rhythm with nonspecific ST-T wave changes.  Based on the aforementioned issues he was admitted and placed on nebulizer therapy, empiric antibiotics awaiting cultures, enteric-coated aspirin 81 mg daily, remained on carvedilol 6.25 mg twice daily and placed on IV Lasix 40 mg every 12 hours.  Patient states that he eats a fairly high sodium diet although is attempting to reduce added salt does eat in fast food restaurants frequently.  Is intermittently compliant with his CPAP.  States he has had shortness of breath gradually  increasing but dramatically worsening over the last 2 days.  He reports compliance with his medications.  Echocardiogram done during this admission reveals an EF around 30%.  There is trivial pericardial effusion with no hemodynamic compromise.   Subjective   Patient denies chest pain but feels weak.  He denies shortness of breath.  He is somewhat anxious regarding his health.  Inpatient Medications    . aspirin EC  81 mg Oral Daily  . carvedilol  6.25 mg Oral BID WC  . enoxaparin (LOVENOX) injection  40 mg Subcutaneous BID  . furosemide  40 mg Intravenous Q12H  . guaiFENesin  600 mg Oral BID  . lisinopril  10 mg Oral Daily  . potassium chloride  40 mEq Oral Q4H  . rosuvastatin  10 mg Oral Daily  . sodium chloride flush  3 mL Intravenous Once  . sodium chloride flush  3 mL Intravenous Q12H    Vital Signs    Vitals:   03/28/20 0520 03/28/20 0634 03/28/20 0745 03/28/20 1153  BP: 113/71  98/71 108/68  Pulse: 80  80 75  Resp: (!) 24  18 18   Temp: 98.6 F (37 C)  98.6 F (37 C) 98.5 F (36.9 C)  TempSrc: Oral  Oral Oral  SpO2: 95% 95% 94% 93%  Weight: (!) 140 kg     Height:        Intake/Output Summary (Last 24 hours) at 03/28/2020 1326 Last data filed at 03/28/2020 1023 Gross per 24 hour  Intake 1303 ml  Output 2051 ml  Net -748 ml   Filed Weights   03/26/20 1920 03/27/20 1005 03/28/20 0520  Weight: (!) 142.9 kg (!) 140.2 kg (!) 140 kg    Physical Exam    GEN: Well nourished, well developed, in no acute distress.  HEENT: normal.  Neck: Supple, no JVD, carotid bruits, or masses. Cardiac: RRR, no murmurs, rubs, or gallops. No clubbing, cyanosis, edema.  Radials/DP/PT 2+ and equal bilaterally.  Respiratory:  Respirations regular and unlabored, clear to auscultation bilaterally. GI: Soft, nontender, nondistended, BS + x 4. MS: no deformity or atrophy. Skin: warm and dry, no rash. Neuro:  Strength and sensation are intact. Psych: Normal affect.  Labs    CBC Recent  Labs    03/26/20 1934 03/28/20 0705  WBC 12.2* 9.7  NEUTROABS  --  6.2  HGB 12.4* 11.8*  HCT 36.4* 35.0*  MCV 96.8 96.2  PLT 313 369   Basic Metabolic Panel Recent Labs    16/10/96 1934 03/27/20 0319 03/28/20 0705  NA 138  --  140  K 3.1*  --  2.9*  CL 99  --  96*  CO2 26  --  30  GLUCOSE 133*  --  135*  BUN 16  --  17  CREATININE 0.91  --  0.88  CALCIUM 8.6*  --  8.6*  MG  --  2.1 2.0   Liver Function Tests Recent Labs    03/27/20 0319  AST 26  ALT 25  ALKPHOS 106  BILITOT 0.8  PROT 7.9  ALBUMIN 3.1*   Recent Labs    03/27/20 0319  LIPASE 23   Cardiac Enzymes No results for input(s): CKTOTAL, CKMB, CKMBINDEX, TROPONINI in the last 72 hours. BNP Recent Labs    03/26/20 1934  BNP 127.7*   D-Dimer No results for input(s): DDIMER in the last 72 hours. Hemoglobin A1C No results for input(s): HGBA1C in the last 72 hours. Fasting Lipid Panel No results for input(s): CHOL, HDL, LDLCALC, TRIG, CHOLHDL, LDLDIRECT in the last 72 hours. Thyroid Function Tests Recent Labs    03/27/20 0319  TSH 1.732    Telemetry    Normal sinus rhythm  ECG    Normal sinus rhythm with no ischemia  Radiology    DG Chest 2 View  Result Date: 03/26/2020 CLINICAL DATA:  Shortness of breath EXAM: CHEST - 2 VIEW COMPARISON:  10/28/2018 FINDINGS: There are small bilateral pleural effusions. Cardiomegaly with vascular congestion. Airspace disease at the left base. No pneumothorax. IMPRESSION: 1. Cardiomegaly with vascular congestion 2. Small bilateral pleural effusion with left basilar airspace disease. Electronically Signed   By: Jasmine Pang M.D.   On: 03/26/2020 20:14   CT Angio Chest PE W/Cm &/Or Wo Cm  Result Date: 03/27/2020 CLINICAL DATA:  Short of breath with chest pain for 2 days. Fever yesterday. EXAM: CT ANGIOGRAPHY CHEST WITH CONTRAST TECHNIQUE: Multidetector CT imaging of the chest was performed using the standard protocol during bolus administration of  intravenous contrast. Multiplanar CT image reconstructions and MIPs were obtained to evaluate the vascular anatomy. CONTRAST:  OMNIPAQUE IOHEXOL 350 MG/ML SOLN COMPARISON:  10/28/2018 FINDINGS: Cardiovascular: There is satisfactory opacification of the central pulmonary arteries. There is somewhat suboptimal opacification of the segmental and smaller vessels peripherally limiting assessment for smaller pulmonary emboli. Allowing for this, there is no evidence of a pulmonary embolism. Heart is normal in size. There is a moderate pericardial effusion which is new since the prior CT. Mild three-vessel coronary artery  calcifications. Great vessels are normal in caliber. No aortic dissection or atherosclerosis. Mediastinum/Nodes: No mediastinal or hilar masses. Visualized thyroid is unremarkable. There are several prominent mediastinal lymph nodes, largest a right paratracheal node measuring 11 mm in short axis. Trachea and esophagus are unremarkable. Lungs/Pleura: Small bilateral pleural effusions. Mild bilateral interstitial thickening accentuated by low lung volumes. There are scattered calcified granuloma most evident in the right lower lobe. Mild dependent atelectasis also noted in the lower lobes. No convincing pneumonia. No pneumothorax. Upper Abdomen: No acute abnormality. Musculoskeletal: No fracture or acute finding. No bone lesion. No chest wall mass. Review of the MIP images confirms the above findings. IMPRESSION: 1. No evidence of a pulmonary embolism. 2. Moderate pericardial effusion with small bilateral pleural effusions. Mild interstitial thickening accentuated by low lung volumes. Interstitial thickening suggests mild interstitial edema. No airspace edema and no evidence of pneumonia. Electronically Signed   By: Amie Portland M.D.   On: 03/27/2020 05:23   ECHOCARDIOGRAM COMPLETE  Result Date: 03/27/2020    ECHOCARDIOGRAM REPORT   Patient Name:   LYDON VANSICKLE Date of Exam: 03/27/2020 Medical Rec  #:  [id number]     Height:       66.0 in Accession #:    [id number]    Weight:       315.0 lb Date of Birth:  11-11-1965     BSA:          2.426 m Patient Age:    54 years      BP:           120/95 mmHg Patient Gender: M             HR:           93 bpm. Exam Location:  [facility] Procedure: 2D Echo and Intracardiac Opacification Agent Indications:     Acute Diastolic CHF  History:         Patient has no prior history of Echocardiogram examinations.                  CHF; Risk Factors:Hypertension, Dyslipidemia, Sleep Apnea and                  Morbid Obesity.  Sonographer:     L Thornton-Maynard Referring Phys:  [id number] Vernetta Honey MANSY Diagnosing Phys: Harold Hedge MD  Sonographer Comments: Patient is morbidly obese. Image acquisition challenging due to patient body habitus. IMPRESSIONS  1. Left ventricular ejection fraction, by estimation, is 35 to 40%. Left ventricular ejection fraction by PLAX is 39 %. The left ventricle has mildly decreased function. The left ventricle has no regional wall motion abnormalities. There is mild left ventricular hypertrophy. Left ventricular diastolic parameters are consistent with Grade I diastolic dysfunction (impaired relaxation).  2. Right ventricular systolic function is normal. The right ventricular size is normal. There is normal pulmonary artery systolic pressure.  3. The mitral valve was not well visualized. No evidence of mitral valve regurgitation.  4. The aortic valve was not well visualized. Aortic valve regurgitation is trivial. FINDINGS  Left Ventricle: Left ventricular ejection fraction, by estimation, is 35 to 40%. Left ventricular ejection fraction by PLAX is 39 %. The left ventricle has mildly decreased function. The left ventricle has no regional wall motion abnormalities. Definity  contrast agent was given IV to delineate the left ventricular endocardial borders. The left ventricular internal cavity size was normal in size. There is mild left ventricular hypertrophy. Left  ventricular diastolic parameters are consistent with  Grade I diastolic dysfunction (impaired relaxation). Right Ventricle: The right ventricular size is normal. No increase in right ventricular wall thickness. Right ventricular systolic function is normal. There is normal pulmonary artery systolic pressure. The tricuspid regurgitant velocity is 2.17 m/s, and  with an assumed right atrial pressure of 10 mmHg, the estimated right ventricular systolic pressure is 28.8 mmHg. Left Atrium: Left atrial size was normal in size. Right Atrium: Right atrial size was normal in size. Pericardium: Trivial pericardial effusion is present. There is no evidence of cardiac tamponade. Mitral Valve: The mitral valve was not well visualized. No evidence of mitral valve regurgitation. MV peak gradient, 3.9 mmHg. The mean mitral valve gradient is 2.0 mmHg. Tricuspid Valve: The tricuspid valve is not well visualized. Tricuspid valve regurgitation is mild. Aortic Valve: The aortic valve was not well visualized. Aortic valve regurgitation is trivial. Aortic valve mean gradient measures 13.0 mmHg. Aortic valve peak gradient measures 17.5 mmHg. Aortic valve area, by VTI measures 1.98 cm. Pulmonic Valve: The pulmonic valve was not well visualized. Pulmonic valve regurgitation is not visualized. Aorta: The aortic root was not well visualized. IAS/Shunts: The interatrial septum was not assessed.  LEFT VENTRICLE PLAX 2D LV EF:         Left            Diastology                ventricular     LV e' lateral:   3.92 cm/s                ejection        LV E/e' lateral: 18.7                fraction by     LV e' medial:    7.07 cm/s                PLAX is 39      LV E/e' medial:  10.4                %. LVIDd:         5.65 cm LVIDs:         4.58 cm LV PW:         1.62 cm LV IVS:        1.99 cm LVOT diam:     3.00 cm LV SV:         71 LV SV Index:   29 LVOT Area:     7.07 cm  RIGHT VENTRICLE RV S prime:     10.00 cm/s TAPSE (M-mode): 2.5 cm LEFT ATRIUM              Index LA diam:        2.80 cm 1.15 cm/m LA Vol (A2C):   35.1 ml 14.47 ml/m LA Vol (A4C):   39.0 ml 16.08 ml/m LA Biplane Vol: 36.5 ml 15.05 ml/m  AORTIC VALVE AV Area (Vmax):    2.23 cm AV Area (Vmean):   2.20 cm AV Area (VTI):     1.98 cm AV Vmax:           209.00 cm/s AV Vmean:          173.000 cm/s AV VTI:            0.357 m AV Peak Grad:      17.5 mmHg AV Mean Grad:      13.0 mmHg LVOT Vmax:  65.90 cm/s LVOT Vmean:        53.900 cm/s LVOT VTI:          0.100 m LVOT/AV VTI ratio: 0.28  AORTA Ao Root diam: 4.20 cm MITRAL VALVE               TRICUSPID VALVE MV Peak grad: 3.9 mmHg     TR Peak grad:   18.8 mmHg MV Mean grad: 2.0 mmHg     TR Vmax:        217.00 cm/s MV Vmax:      0.99 m/s MV Vmean:     60.4 cm/s    SHUNTS MV E velocity: 73.30 cm/s  Systemic VTI:  0.10 m MV A velocity: 79.30 cm/s  Systemic Diam: 3.00 cm MV E/A ratio:  0.92 Bartholome Bill MD Electronically signed by Bartholome Bill MD Signature Date/Time: 03/27/2020/10:13:35 AM    Final    US ABDOMEN LIMITED RUQ  Result Date: 03/27/2020 CLINICAL DATA:  Epigastric/right upper quadrant pain EXAM: ULTRASOUND ABDOMEN LIMITED RIGHT UPPER QUADRANT COMPARISON:  None. FINDINGS: Gallbladder: Shadowing gallstone. Prominent gallbladder wall thickness but under distended. No pericholecystic edema or reported focal tenderness. Common bile duct: Diameter: 4 mm Liver: Echogenic liver with diminished acoustic penetration and pericholecystic sparing. No evidence of focal lesion. Portal vein is patent on color Doppler imaging with normal direction of blood flow towards the liver. IMPRESSION: 1. Cholelithiasis without evidence of cholecystitis. 2. Hepatic steatosis. Electronically Signed   By: Monte Fantasia M.D.   On: 03/27/2020 07:22    Assessment & Plan    54 year old male with history of heart failure, obesity, sleep apnea, known ejection fraction of 40 % with moderate AR and MR who presented to the emergency room with complaints of  shortness of breath which had acutely exacerbated over the last several days.  Have been present for some time previously.  As an outpatient he was last seen by cardiology in 2016.  As mentioned above, ejection fraction at that time was around 40% with global hypokinesis.  He has been treated with Crestor at 10 mg daily, furosemide 80 mg daily, carvedilol 6.25 mg twice daily as well as a Ventolin rescue inhaler.  He is intermittently compliant with CPAP.  1.  Congestive heart failure-EF appears to have dropped from 40% to 30% in the last 5 to 6 years.  Does not appear that this drop appears acute although intermittent data is not available.  He appears to have ruled out for myocardial infarction.  His BNP is only minimally elevated at 127.  He is Covid negative.  He denies chest pain.  He does have a small pericardial effusion however this does not show any evidence of hemodynamic compromise.  CT showed no pulmonary embolus.  Will continue with carvedilol.  Patient's echo shows mild drop in his LV function but is near his baseline.  We will continue to carefully diurese.  Serum potassium is somewhat low need to replete this.  We will continue to defer Entresto and try to treat with afterload reduction with lisinopril following hemodynamics.  Blood pressure is somewhat soft today limiting ability to use spironolactone however this would be a good choice given his relative hypokalemia and if his pressure tolerates in the future.  2.  Sleep apnea-weight loss is imperative.  Also compliance daily with CPAP was again discussed and is imperative to his wellbeing.  We will further discuss this during hospitalization and as an outpatient.  Patient states that he is  compliant although on admission states that he frequently does not use it.  I stressed the importance of compliance with this.  3.  Hyperlipidemia-continue with Crestor 10 mg daily.  Outpatient follow-up of his lipid profile can be carried out.  4.   Bronchospasm-continue with Ventolin rescue inhaler.  Patient is on empiric antibiotic therapy.  Can continue with this for now.  5.  Exogenous obesity.-Weight loss is recommended.  States that he understands this.   Signed, Darlin PriestlyKenneth A. Cherril Hett MD 03/28/2020, 1:26 PM  Pager: [phone]

## 2020-03-28 NOTE — Consult Note (Signed)
PHARMACY CONSULT NOTE - FOLLOW UP  Pharmacy Consult for Electrolyte Monitoring and Replacement   Recent Labs: Potassium (mmol/L)  Date Value  03/28/2020 3.6  03/08/2014 3.5   Magnesium (mg/dL)  Date Value  71/25/2479 2.0   Calcium (mg/dL)  Date Value  98/00/1239 8.6 (L)   Calcium, Total (mg/dL)  Date Value  35/94/0905 8.1 (L)   Albumin (g/dL)  Date Value  02/56/1548 3.1 (L)  03/06/2014 2.6 (L)   Sodium (mmol/L)  Date Value  03/28/2020 140  03/08/2014 135 (L)   Assessment: Pharmacy consulted for electrolyte monitoring and replacement 54 yo male admitted with acute on chronic CHF and fever.   Patient is also currently receiving furosemide 40mg  IV every 12 hours and lisinopril 10mg .   Goal of Therapy:  Electrolytes WNL  Plan:  6/6 AM: K:2.9- Will order KCL every 4 hours x 2 doses.  Will recheck K+ this evening @ 1800.  6/6:  K @ 1749 = 3.6 No additional K supplementation needed at this time.   Will recheck electrolytes on 6/7 with AM labs.   Pharmacy will continue to monitor and order replacement as needed.   , PharmD Clinical Pharmacist 03/28/2020 6:52 PM

## 2020-03-29 LAB — CBC WITH DIFFERENTIAL/PLATELET
Abs Immature Granulocytes: 0.04 10*3/uL (ref 0.00–0.07)
Basophils Absolute: 0.1 10*3/uL (ref 0.0–0.1)
Basophils Relative: 1 %
Eosinophils Absolute: 0.3 10*3/uL (ref 0.0–0.5)
Eosinophils Relative: 3 %
HCT: 32.7 % — ABNORMAL LOW (ref 39.0–52.0)
Hemoglobin: 11.1 g/dL — ABNORMAL LOW (ref 13.0–17.0)
Immature Granulocytes: 1 %
Lymphocytes Relative: 26 %
Lymphs Abs: 2.1 10*3/uL (ref 0.7–4.0)
MCH: 32.6 pg (ref 26.0–34.0)
MCHC: 33.9 g/dL (ref 30.0–36.0)
MCV: 95.9 fL (ref 80.0–100.0)
Monocytes Absolute: 1 10*3/uL (ref 0.1–1.0)
Monocytes Relative: 13 %
Neutro Abs: 4.6 10*3/uL (ref 1.7–7.7)
Neutrophils Relative %: 56 %
Platelets: 390 10*3/uL (ref 150–400)
RBC: 3.41 MIL/uL — ABNORMAL LOW (ref 4.22–5.81)
RDW: 12.4 % (ref 11.5–15.5)
WBC: 8.1 10*3/uL (ref 4.0–10.5)
nRBC: 0 % (ref 0.0–0.2)

## 2020-03-29 LAB — BASIC METABOLIC PANEL
Anion gap: 11 (ref 5–15)
BUN: 21 mg/dL — ABNORMAL HIGH (ref 6–20)
CO2: 29 mmol/L (ref 22–32)
Calcium: 8.7 mg/dL — ABNORMAL LOW (ref 8.9–10.3)
Chloride: 100 mmol/L (ref 98–111)
Creatinine, Ser: 0.9 mg/dL (ref 0.61–1.24)
GFR calc Af Amer: >60 mL/min (ref >60)
GFR calc non Af Amer: >60 mL/min (ref >60)
Glucose, Bld: 108 mg/dL — ABNORMAL HIGH (ref 70–99)
Potassium: 3.7 mmol/L (ref 3.5–5.1)
Sodium: 140 mmol/L (ref 135–145)

## 2020-03-29 LAB — BRAIN NATRIURETIC PEPTIDE: B Natriuretic Peptide: 79.2 pg/mL (ref 0.0–100.0)

## 2020-03-29 LAB — MAGNESIUM: Magnesium: 2.2 mg/dL (ref 1.7–2.4)

## 2020-03-29 MED ORDER — IPRATROPIUM-ALBUTEROL 0.5-2.5 (3) MG/3ML IN SOLN: 3.0000 mL | RESPIRATORY_TRACT | 1 refills | Status: DC | PRN

## 2020-03-29 MED ORDER — FUROSEMIDE 40 MG PO TABS: 40.0000 mg | ORAL_TABLET | Freq: Every day | ORAL | Status: DC

## 2020-03-29 MED ORDER — LISINOPRIL 5 MG PO TABS: 5.0000 mg | ORAL_TABLET | Freq: Every day | ORAL | Status: DC

## 2020-03-29 MED ORDER — POTASSIUM CHLORIDE CRYS ER 20 MEQ PO TBCR
20.0000 meq | EXTENDED_RELEASE_TABLET | Freq: Once | ORAL | Status: AC
  Administered 2020-03-29: 20 meq via ORAL
  Filled 2020-03-29: qty 1

## 2020-03-29 MED ORDER — LISINOPRIL 5 MG PO TABS: 5.0000 mg | ORAL_TABLET | Freq: Every day | ORAL | 0 refills | Status: DC

## 2020-03-29 MED ORDER — ASPIRIN 81 MG PO TBEC: 81.0000 mg | DELAYED_RELEASE_TABLET | Freq: Every day | ORAL | 0 refills | Status: DC

## 2020-03-29 NOTE — Discharge Summary (Signed)
[facility] [place] [facility]   PATIENT NAME: Bradley Velez    MR#:  [id number]  DATE OF BIRTH:  [birth year]  DATE OF ADMISSION:  03/27/2020 ADMITTING PHYSICIAN: Hannah Beat, MD  DATE OF DISCHARGE: 03/29/2020  PRIMARY CARE PHYSICIAN: Carlean Jews, NP    ADMISSION DIAGNOSIS:  Shortness of breath [R06.02] Hypokalemia [E87.6] Pericardial effusion [I31.3] Acute on chronic diastolic CHF (congestive heart failure) (HCC) [I50.33] Acute on chronic congestive heart failure, unspecified heart failure type (HCC) [I50.9]  DISCHARGE DIAGNOSIS:  Acute on chronic Diastolic CHF Morbid obesity with OSA on CPAP SECONDARY DIAGNOSIS:   Past Medical History:  Diagnosis Date  . Hyperlipidemia   . Hypertension   . Obesity   . Sleep apnea     HOSPITAL COURSE:   Bradley Velez a54 y.o.Caucasian malewith a known history of diastolic CHF, hypertension, dyslipidemia and obstructive sleep apnea with obesity, presented to the emergency room with acute onset of worsening dyspnea over the last couple of days with associated cough that has been mainly dry with wheezing and paroxysmal nocturnal dyspnea with lower extremity edema which has not been worsening on left lower extremity lymphedema  1. Acute on chronic diastolic CHF. - IV Lasix. 40 mg bid--good uop--change to home dose lasix 80 mg qg TSH and magnesium level -BNP 127--marginal increase - Dr. Lady Gary input appreciated. Continue cardiac meds. Not much room to increase dosage given soft blood pressure. -pt will make appt with Dr Kirtland Bouchard and f/u [facility] HF clinic -Charity nebs provided  2. Fever with associated right upper quadrant abdominal pain and tenderness. He also has cough and wheezing though there was no clear evidence of pneumonia on his chest CTA-- resolved -Pro calcitonin negative. No fever. White count 12.8. Patient is denies any abdominal pain. -Workup for infection negative. -Ultrasound abdomen shows  gallstones. No gallbladder wall thickness. -BC negative -d/c rocephin  3. Hypokalemia. -Potassium repleted and magnesium is 2.1  4. Hypertension. - continue Coreg and Cozaar.  5. Dyslipidemia. - continue statin therapy and Lovaza.  6. Obstructive sleep apnea. -continued on CPAP nightly.  7. DVT prophylaxis. -Subcutaneous Lovenox.    CODE STATUS: Full code  Status is: Inpatient  Treatment for CHF  Dispo: The patient is from:Home Anticipated d/c is GG:[facility]IRS Anticipated d/c date is: 03/29/2020 Patient currentlyis medically stable to d/c.   Procedures:none Family communication :none Consults :Cardiolgoy Dr Lady Gary CODE STATUS: FULL DVT Prophylaxis :Lovenox CONSULTS OBTAINED:  Treatment Team:  Dalia Heading, MD  DRUG ALLERGIES:  No Known Allergies  DISCHARGE MEDICATIONS:   Allergies as of 03/29/2020   No Known Allergies     Medication List    TAKE these medications   albuterol 108 (90 Base) MCG/ACT inhaler Commonly known as: Ventolin HFA Inhale 2 puffs into the lungs every 6 (six) hours as needed for wheezing or shortness of breath.   aspirin 81 MG EC tablet Take 1 tablet (81 mg total) by mouth daily.   carvedilol 6.25 MG tablet Commonly known as: COREG Take 1 tablet by mouth twice daily   furosemide 80 MG tablet Commonly known as: LASIX TAKE 1 TABLET BY MOUTH ONCE DAILY   ipratropium-albuterol 0.5-2.5 (3) MG/3ML Soln Commonly known as: DUONEB Take 3 mLs by nebulization every 4 (four) hours as needed.   lisinopril 5 MG tablet Commonly known as: ZESTRIL Take 1 tablet (5 mg total) by mouth daily.   rosuvastatin 10 MG tablet Commonly known as: CRESTOR Take 1 tablet (10 mg total) by mouth daily.  Durable Medical Equipment  (From admission, onward)         Start     Ordered   03/29/20 1001  For home use only DME Nebulizer machine  Once    Question Answer Comment  Patient  needs a nebulizer to treat with the following condition CHF (congestive heart failure) (HCC)   Length of Need Lifetime      03/29/20 1001          If you experience worsening of your admission symptoms, develop shortness of breath, life threatening emergency, suicidal or homicidal thoughts you must seek medical attention immediately by calling 911 or calling your MD immediately  if symptoms less severe.  You Must read complete instructions/literature along with all the possible adverse reactions/side effects for all the Medicines you take and that have been prescribed to you. Take any new Medicines after you have completely understood and accept all the possible adverse reactions/side effects.   Please note  You were cared for by a hospitalist during your hospital stay. If you have any questions about your discharge medications or the care you received while you were in the hospital after you are discharged, you can call the unit and asked to speak with the hospitalist on call if the hospitalist that took care of you is not available. Once you are discharged, your primary care physician will handle any further medical issues. Please note that NO REFILLS for any discharge medications will be authorized once you are discharged, as it is imperative that you return to your primary care physician (or establish a relationship with a primary care physician if you do not have one) for your aftercare needs so that they can reassess your need for medications and monitor your lab values. Today   SUBJECTIVE   No new complaints  VITAL SIGNS:  Blood pressure (!) 101/58, pulse 81, temperature 99.1 F (37.3 C), temperature source Oral, resp. rate 19, height 5\' 6"  (1.676 m), weight (!) 139.6 kg, SpO2 100 %.  I/O:    Intake/Output Summary (Last 24 hours) at 03/29/2020 1011 Last data filed at 03/28/2020 2021 Gross per 24 hour  Intake 720 ml  Output 800 ml  Net -80 ml    PHYSICAL EXAMINATION:  GENERAL:   54 y.o.-year-old patient lying in the bed with no acute distress. Morbid Obesity EYES: Pupils equal, round, reactive to light and accommodation. No scleral icterus.  HEENT: Head atraumatic, normocephalic. Oropharynx and nasopharynx clear.  NECK:  Supple, no jugular venous distention. No thyroid enlargement, no tenderness.  LUNGS: Normal breath sounds bilaterally, no wheezing, rales,rhonchi or crepitation. No use of accessory muscles of respiration.  CARDIOVASCULAR: S1, S2 normal. No murmurs, rubs, or gallops.  ABDOMEN: Soft, non-tender, non-distended. Bowel sounds present. No organomegaly or mass.  EXTREMITIES: bilateral chronic venous changes,no cyanosis, or clubbing.  NEUROLOGIC: Cranial nerves II through XII are intact. Muscle strength 5/5 in all extremities. Sensation intact. Gait not checked.  PSYCHIATRIC: patient is alert and oriented x 3.  SKIN: No obvious rash, lesion, or ulcer.   DATA REVIEW:   CBC  Recent Labs  Lab 03/29/20 0416  WBC 8.1  HGB 11.1*  HCT 32.7*  PLT 390    Chemistries  Recent Labs  Lab 03/27/20 0319 03/28/20 0705 03/29/20 0416  NA  --    < > 140  K  --    < > 3.7  CL  --    < > 100  CO2  --    < >  29  GLUCOSE  --    < > 108*  BUN  --    < > 21*  CREATININE  --    < > 0.90  CALCIUM  --    < > 8.7*  MG 2.1   < > 2.2  AST 26  --   --   ALT 25  --   --   ALKPHOS 106  --   --   BILITOT 0.8  --   --    < > = values in this interval not displayed.    Microbiology Results   Recent Results (from the past 240 hour(s))  Culture, blood (routine x 2)     Status: None (Preliminary result)   Collection Time: 03/27/20  3:19 AM   Specimen: BLOOD  Result Value Ref Range Status   Specimen Description BLOOD RIGHT ANTECUBITAL  Final   Special Requests   Final    BOTTLES DRAWN AEROBIC AND ANAEROBIC Blood Culture adequate volume   Culture   Final    NO GROWTH 2 DAYS Performed at [facility], [address], [place], [place] [address]    Report  Status PENDING  Incomplete  Culture, blood (routine x 2)     Status: None (Preliminary result)   Collection Time: 03/27/20  3:19 AM   Specimen: BLOOD  Result Value Ref Range Status   Specimen Description BLOOD LEFT ANTECUBITAL  Final   Special Requests   Final    BOTTLES DRAWN AEROBIC AND ANAEROBIC Blood Culture adequate volume   Culture   Final    NO GROWTH 2 DAYS Performed at [facility], [address], [place], [place] [address]    Report Status PENDING  Incomplete  SARS Coronavirus 2 by RT PCR (hospital order, performed in [facility] hospital lab) Nasopharyngeal Nasopharyngeal Swab     Status: None   Collection Time: 03/27/20  3:19 AM   Specimen: Nasopharyngeal Swab  Result Value Ref Range Status   SARS Coronavirus 2 NEGATIVE NEGATIVE Final    Comment: (NOTE) SARS-CoV-2 target nucleic acids are NOT DETECTED. The SARS-CoV-2 RNA is generally detectable in upper and lower respiratory specimens during the acute phase of infection. The lowest concentration of SARS-CoV-2 viral copies this assay can detect is 250 copies / mL. A negative result does not preclude SARS-CoV-2 infection and should not be used as the sole basis for treatment or other patient management decisions.  A negative result may occur with improper specimen collection / handling, submission of specimen other than nasopharyngeal swab, presence of viral mutation(s) within the areas targeted by this assay, and inadequate number of viral copies (<250 copies / mL). A negative result must be combined with clinical observations, patient history, and epidemiological information. Fact Sheet for Patients:   BoilerBrush.com.cy Fact Sheet for Healthcare Providers: https://pope.com/ This test is not yet approved or cleared  by the [place] FDA and has been authorized for detection and/or diagnosis of SARS-CoV-2 by FDA under an Emergency Use Authorization (EUA).   This EUA will remain in effect (meaning this test can be used) for the duration of the COVID-19 declaration under Section 564(b)(1) of the Act, 21 U.S.C. section 360bbb-3(b)(1), unless the authorization is terminated or revoked sooner. Performed at [facility], [address], [place], [place] [address]     RADIOLOGY:  No results found.   CODE STATUS:     Code Status Orders  (From admission, onward)  Start     Ordered   03/27/20 0618  Full code  Continuous     03/27/20 0624        Code Status History    Date Active Date Inactive Code Status Order ID Comments User Context   10/28/2018 2023 10/29/2018 1928 Full Code [id number]  Epifanio Lesches, MD ED   Advance Care Planning Activity       TOTAL TIME TAKING CARE OF THIS PATIENT: *40* minutes.    Fritzi Mandes M.D  [facility]    CC: Primary care physician; Ronnell Freshwater, NP

## 2020-03-29 NOTE — Progress Notes (Signed)
[name] to be D/C'd Home per MD order. Patient given discharge teaching and paperwork regarding medications, diet, follow-up appointments and activity. Patient understanding verbalized. No questions or complaints at this time. Skin condition as charted. IV and telemetry removed prior to leaving.  No further needs by Care Management/Social Work. Prescriptions e-prescribed by MD. Nebulizer delivered to patient.   An After Visit Summary was printed and given to the patient.   Patient escorted via wheelchair.  Bradley Velez

## 2020-03-29 NOTE — Consult Note (Signed)
PHARMACY CONSULT NOTE - FOLLOW UP  Pharmacy Consult for Electrolyte Monitoring and Replacement   Recent Labs: Potassium (mmol/L)  Date Value  03/29/2020 3.7  03/08/2014 3.5   Magnesium (mg/dL)  Date Value  47/39/5844 2.2   Calcium (mg/dL)  Date Value  17/09/7870 8.7 (L)   Calcium, Total (mg/dL)  Date Value  83/67/2550 8.1 (L)   Albumin (g/dL)  Date Value  01/64/2903 3.1 (L)  03/06/2014 2.6 (L)   Sodium (mmol/L)  Date Value  03/29/2020 140  03/08/2014 135 (L)   Assessment: Pharmacy consulted for electrolyte monitoring and replacement 54 yo male admitted with acute on chronic CHF and fever.   Patient is also currently receiving furosemide 40mg  IV every 12 hours and lisinopril 10mg .   Goal of Therapy:  Electrolytes WNL  Plan:  Will order KCL PO x 1 doses due to IV lasix.  Follow up with labs in the AM. Pharmacy will continue to monitor and order replacement as needed.   , PharmD, BCPS Clinical Pharmacist 03/29/2020 7:57 AM

## 2020-03-29 NOTE — Progress Notes (Signed)
Patient Name: Bradley Velez Date of Encounter: 03/29/2020  Hospital Problem List     Active Problems:   Acute on chronic diastolic CHF (congestive heart failure) (HCC)   Hypokalemia   Obesity, Class III, BMI 40-49.9 (morbid obesity) (HCC)   Pericardial effusion   OSA on CPAP    Patient Profile      54 y.o.malewith history ofcongestive heart failure with ejection fraction by echo in 2016 showing ejection fraction of 40% with mild AR moderate MR and TR, hypertension, dyslipidemia, obstructive sleep apnea with obesity who presented to the emergency room with complaints of increasing dyspnea over the last several days including lower extremity edema which is chronic. He states he had a fever of 102 with associated chills. He received a second 150 COVID-19 vaccine on January 27, 2020. Is afebrile on presentation. He had hypokalemia with a K of 3.1 and a BNP of 127 with high-sensitivity troponin XX 05/2030. COVID-19 PCR was negative. Blood cultures are pending. Chest x-ray showed small bilateral pleural effusions with a left basal airspace disease. CT of the chest showed no pulmonary embolus. There was felt on CT to have pericardial effusion. Echocardiogram showed ef of approximately 40% which is not appreciably changed from previous echo.   Subjective   Still weak and fatigued.   Inpatient Medications    . aspirin EC  81 mg Oral Daily  . carvedilol  6.25 mg Oral BID WC  . enoxaparin (LOVENOX) injection  40 mg Subcutaneous BID  . furosemide  40 mg Intravenous Q12H  . guaiFENesin  600 mg Oral BID  . lisinopril  5 mg Oral Daily  . potassium chloride  20 mEq Oral Once  . rosuvastatin  10 mg Oral Daily  . sodium chloride flush  3 mL Intravenous Once  . sodium chloride flush  3 mL Intravenous Q12H    Vital Signs    Vitals:   03/28/20 1958 03/28/20 2349 03/29/20 0544 03/29/20 0811  BP: 104/70  122/63 (!) 101/58  Pulse: 89  72 81  Resp: 16 (!) 24 16 19   Temp: 98.6 F (37 C)   98.3 F (36.8 C) 99.1 F (37.3 C)  TempSrc: Oral  Oral Oral  SpO2: 94%  99% 100%  Weight:   (!) 139.6 kg   Height:        Intake/Output Summary (Last 24 hours) at 03/29/2020 0946 Last data filed at 03/28/2020 2021 Gross per 24 hour  Intake 960 ml  Output 800 ml  Net 160 ml   Filed Weights   03/27/20 1005 03/28/20 0520 03/29/20 0544  Weight: (!) 140.2 kg (!) 140 kg (!) 139.6 kg    Physical Exam    GEN: Well nourished, well developed, in no acute distress.  HEENT: normal.  Neck: Supple, no JVD, carotid bruits, or masses. Cardiac: RRR, no murmurs, rubs, or gallops. No clubbing, cyanosis, edema.  Radials/DP/PT 2+ and equal bilaterally.  Respiratory:  Respirations regular and unlabored, clear to auscultation bilaterally. GI: Soft, nontender, nondistended, BS + x 4. MS: no deformity or atrophy. Skin: warm and dry, no rash. Neuro:  Strength and sensation are intact. Psych: Normal affect.  Labs    CBC Recent Labs    03/28/20 0705 03/29/20 0416  WBC 9.7 8.1  NEUTROABS 6.2 4.6  HGB 11.8* 11.1*  HCT 35.0* 32.7*  MCV 96.2 95.9  PLT 369 390   Basic Metabolic Panel Recent Labs    05/29/20 0705 03/28/20 0705 03/28/20 1749 03/29/20 0416  NA 140  --   --  140  K 2.9*   < > 3.6 3.7  CL 96*  --   --  100  CO2 30  --   --  29  GLUCOSE 135*  --   --  108*  BUN 17  --   --  21*  CREATININE 0.88  --   --  0.90  CALCIUM 8.6*  --   --  8.7*  MG 2.0  --   --  2.2   < > = values in this interval not displayed.   Liver Function Tests Recent Labs    03/27/20 0319  AST 26  ALT 25  ALKPHOS 106  BILITOT 0.8  PROT 7.9  ALBUMIN 3.1*   Recent Labs    03/27/20 0319  LIPASE 23   Cardiac Enzymes No results for input(s): CKTOTAL, CKMB, CKMBINDEX, TROPONINI in the last 72 hours. BNP Recent Labs    03/26/20 1934  BNP 127.7*   D-Dimer No results for input(s): DDIMER in the last 72 hours. Hemoglobin A1C No results for input(s): HGBA1C in the last 72 hours. Fasting Lipid  Panel No results for input(s): CHOL, HDL, LDLCALC, TRIG, CHOLHDL, LDLDIRECT in the last 72 hours. Thyroid Function Tests Recent Labs    03/27/20 0319  TSH 1.732    Telemetry    nsr with no significant ectopy  ECG    nsr with no ischemia  Radiology    DG Chest 2 View  Result Date: 03/26/2020 CLINICAL DATA:  Shortness of breath EXAM: CHEST - 2 VIEW COMPARISON:  10/28/2018 FINDINGS: There are small bilateral pleural effusions. Cardiomegaly with vascular congestion. Airspace disease at the left base. No pneumothorax. IMPRESSION: 1. Cardiomegaly with vascular congestion 2. Small bilateral pleural effusion with left basilar airspace disease. Electronically Signed   By: Donavan Foil M.D.   On: 03/26/2020 20:14   CT Angio Chest PE W/Cm &/Or Wo Cm  Result Date: 03/27/2020 CLINICAL DATA:  Short of breath with chest pain for 2 days. Fever yesterday. EXAM: CT ANGIOGRAPHY CHEST WITH CONTRAST TECHNIQUE: Multidetector CT imaging of the chest was performed using the standard protocol during bolus administration of intravenous contrast. Multiplanar CT image reconstructions and MIPs were obtained to evaluate the vascular anatomy. CONTRAST:  170mL OMNIPAQUE IOHEXOL 350 MG/ML SOLN COMPARISON:  10/28/2018 FINDINGS: Cardiovascular: There is satisfactory opacification of the central pulmonary arteries. There is somewhat suboptimal opacification of the segmental and smaller vessels peripherally limiting assessment for smaller pulmonary emboli. Allowing for this, there is no evidence of a pulmonary embolism. Heart is normal in size. There is a moderate pericardial effusion which is new since the prior CT. Mild three-vessel coronary artery calcifications. Great vessels are normal in caliber. No aortic dissection or atherosclerosis. Mediastinum/Nodes: No mediastinal or hilar masses. Visualized thyroid is unremarkable. There are several prominent mediastinal lymph nodes, largest a right paratracheal node measuring 11 mm  in short axis. Trachea and esophagus are unremarkable. Lungs/Pleura: Small bilateral pleural effusions. Mild bilateral interstitial thickening accentuated by low lung volumes. There are scattered calcified granuloma most evident in the right lower lobe. Mild dependent atelectasis also noted in the lower lobes. No convincing pneumonia. No pneumothorax. Upper Abdomen: No acute abnormality. Musculoskeletal: No fracture or acute finding. No bone lesion. No chest wall mass. Review of the MIP images confirms the above findings. IMPRESSION: 1. No evidence of a pulmonary embolism. 2. Moderate pericardial effusion with small bilateral pleural effusions. Mild interstitial thickening accentuated by low lung volumes. Interstitial thickening suggests mild interstitial edema. No airspace  edema and no evidence of pneumonia. Electronically Signed   By: Amie Portland M.D.   On: 03/27/2020 05:23   ECHOCARDIOGRAM COMPLETE  Result Date: 03/27/2020    ECHOCARDIOGRAM REPORT   Patient Name:   MAXON KRESSE Date of Exam: 03/27/2020 Medical Rec #:  [id number]     Height:       66.0 in Accession #:    [id number]    Weight:       315.0 lb Date of Birth:  Dec 25, 1965     BSA:          2.426 m Patient Age:    54 years      BP:           120/95 mmHg Patient Gender: M             HR:           93 bpm. Exam Location:  [facility] Procedure: 2D Echo and Intracardiac Opacification Agent Indications:     Acute Diastolic CHF  History:         Patient has no prior history of Echocardiogram examinations.                  CHF; Risk Factors:Hypertension, Dyslipidemia, Sleep Apnea and                  Morbid Obesity.  Sonographer:     L Thornton-Maynard Referring Phys:  [id number] Vernetta Honey MANSY Diagnosing Phys: Harold Hedge MD  Sonographer Comments: Patient is morbidly obese. Image acquisition challenging due to patient body habitus. IMPRESSIONS  1. Left ventricular ejection fraction, by estimation, is 35 to 40%. Left ventricular ejection fraction by PLAX is 39 %. The  left ventricle has mildly decreased function. The left ventricle has no regional wall motion abnormalities. There is mild left ventricular hypertrophy. Left ventricular diastolic parameters are consistent with Grade I diastolic dysfunction (impaired relaxation).  2. Right ventricular systolic function is normal. The right ventricular size is normal. There is normal pulmonary artery systolic pressure.  3. The mitral valve was not well visualized. No evidence of mitral valve regurgitation.  4. The aortic valve was not well visualized. Aortic valve regurgitation is trivial. FINDINGS  Left Ventricle: Left ventricular ejection fraction, by estimation, is 35 to 40%. Left ventricular ejection fraction by PLAX is 39 %. The left ventricle has mildly decreased function. The left ventricle has no regional wall motion abnormalities. Definity  contrast agent was given IV to delineate the left ventricular endocardial borders. The left ventricular internal cavity size was normal in size. There is mild left ventricular hypertrophy. Left ventricular diastolic parameters are consistent with Grade I diastolic dysfunction (impaired relaxation). Right Ventricle: The right ventricular size is normal. No increase in right ventricular wall thickness. Right ventricular systolic function is normal. There is normal pulmonary artery systolic pressure. The tricuspid regurgitant velocity is 2.17 m/s, and  with an assumed right atrial pressure of 10 mmHg, the estimated right ventricular systolic pressure is 28.8 mmHg. Left Atrium: Left atrial size was normal in size. Right Atrium: Right atrial size was normal in size. Pericardium: Trivial pericardial effusion is present. There is no evidence of cardiac tamponade. Mitral Valve: The mitral valve was not well visualized. No evidence of mitral valve regurgitation. MV peak gradient, 3.9 mmHg. The mean mitral valve gradient is 2.0 mmHg. Tricuspid Valve: The tricuspid valve is not well visualized.  Tricuspid valve regurgitation is mild. Aortic Valve: The aortic valve was not well visualized. Aortic valve  regurgitation is trivial. Aortic valve mean gradient measures 13.0 mmHg. Aortic valve peak gradient measures 17.5 mmHg. Aortic valve area, by VTI measures 1.98 cm. Pulmonic Valve: The pulmonic valve was not well visualized. Pulmonic valve regurgitation is not visualized. Aorta: The aortic root was not well visualized. IAS/Shunts: The interatrial septum was not assessed.  LEFT VENTRICLE PLAX 2D LV EF:         Left            Diastology                ventricular     LV e' lateral:   3.92 cm/s                ejection        LV E/e' lateral: 18.7                fraction by     LV e' medial:    7.07 cm/s                PLAX is 39      LV E/e' medial:  10.4                %. LVIDd:         5.65 cm LVIDs:         4.58 cm LV PW:         1.62 cm LV IVS:        1.99 cm LVOT diam:     3.00 cm LV SV:         71 LV SV Index:   29 LVOT Area:     7.07 cm  RIGHT VENTRICLE RV S prime:     10.00 cm/s TAPSE (M-mode): 2.5 cm LEFT ATRIUM             Index LA diam:        2.80 cm 1.15 cm/m LA Vol (A2C):   35.1 ml 14.47 ml/m LA Vol (A4C):   39.0 ml 16.08 ml/m LA Biplane Vol: 36.5 ml 15.05 ml/m  AORTIC VALVE AV Area (Vmax):    2.23 cm AV Area (Vmean):   2.20 cm AV Area (VTI):     1.98 cm AV Vmax:           209.00 cm/s AV Vmean:          173.000 cm/s AV VTI:            0.357 m AV Peak Grad:      17.5 mmHg AV Mean Grad:      13.0 mmHg LVOT Vmax:         65.90 cm/s LVOT Vmean:        53.900 cm/s LVOT VTI:          0.100 m LVOT/AV VTI ratio: 0.28  AORTA Ao Root diam: 4.20 cm MITRAL VALVE               TRICUSPID VALVE MV Peak grad: 3.9 mmHg     TR Peak grad:   18.8 mmHg MV Mean grad: 2.0 mmHg     TR Vmax:        217.00 cm/s MV Vmax:      0.99 m/s MV Vmean:     60.4 cm/s    SHUNTS MV E velocity: 73.30 cm/s  Systemic VTI:  0.10 m MV A velocity: 79.30 cm/s  Systemic Diam: 3.00 cm MV E/A ratio:  0.92 Harold Hedge MD Electronically  signed  by Harold HedgeKenneth Rosaland Shiffman MD Signature Date/Time: 03/27/2020/10:13:35 AM    Final    US ABDOMEN LIMITED RUQ  Result Date: 03/27/2020 CLINICAL DATA:  Epigastric/right upper quadrant pain EXAM: ULTRASOUND ABDOMEN LIMITED RIGHT UPPER QUADRANT COMPARISON:  None. FINDINGS: Gallbladder: Shadowing gallstone. Prominent gallbladder wall thickness but under distended. No pericholecystic edema or reported focal tenderness. Common bile duct: Diameter: 4 mm Liver: Echogenic liver with diminished acoustic penetration and pericholecystic sparing. No evidence of focal lesion. Portal vein is patent on color Doppler imaging with normal direction of blood flow towards the liver. IMPRESSION: 1. Cholelithiasis without evidence of cholecystitis. 2. Hepatic steatosis. Electronically Signed   By: Marnee SpringJonathon  Watts M.D.   On: 03/27/2020 07:22    Assessment & Plan    54 year old male with history of heart failure, obesity, sleep apnea, known ejection fraction of 40 % with moderate AR and MR who presented to the emergency room with complaints of shortness of breath which had acutely exacerbated over the last several days. Have been present for some time previously. As an outpatient he was last seen by cardiology in 2016. As mentioned above, ejection fraction at that time was around 40% with global hypokinesis. He has been treated with Crestor at 10 mg daily, furosemide 80 mg daily, carvedilol 6.25 mg twice daily as well as a Ventolin rescue inhaler. He is intermittently compliant with CPAP.  1. Congestive heart failure-EF appears to have remained fairly stable after further review of his echo.   He appears to have ruled out for myocardial infarction. His BNP is only minimally elevated at 127. Will recheck today. He has diuresed 528 cc since admissioni.  He is Covid negative. He denies chest pain. He does have a small pericardial effusion however this does not show any evidence of hemodynamic compromise. CT showed no pulmonary  embolus. Will continue with carvedilol at 6.25 mg bid and follow pressures and heart rate.  .  We will continue to carefully diurese.    Potassium is improved today. We will reduce the lisinopril to 5 mg daily due to soft blood pressure. Blood pressure is somewhat soft  limiting ability to use spironolactone however this would be a good choice given his relative hypokalemia and if his pressure tolerates in the future.  2. Sleep apnea-weight loss is imperative. Also compliance daily with CPAP was again discussed and is imperative to his wellbeing. We will further discuss this during hospitalization and as an outpatient.  Patient states that he is compliant although on admission states that he frequently does not use it.  I stressed the importance of compliance with this.  3. Hyperlipidemia-continue with Crestor 10 mg daily. Outpatient follow-up of his lipid profile can be carried out.  4. Bronchospasm-continue with Ventolin rescue inhaler. Patient is on empiric antibiotic therapy. Can continue with this for now.  5.  Exogenous obesity.-Weight loss is recommended.  States that he understands this.  Signed, Darlin PriestlyKenneth A. Zakee Deerman MD 03/29/2020, 9:46 AM  Pager: [phone]

## 2020-03-29 NOTE — TOC Initial Note (Signed)
Transition of Care [facility]) - Initial/Assessment Note    Patient Details  Name: Bradley Velez MRN: [id number] Date of Birth: 09-24-1966  [facility]) CM/SW Contact:    Shelbie Ammons, RN Phone Number: 03/29/2020, 4:02 PM  Clinical Narrative:    RNCM met with patient at bedside, patient reports to feeling some better today but still reports to having many "unanswered questions". Patient reports that he is aware of management of his HF and understands that he should weigh daily, that he should record his weights and report any gains of 2-3 pounds in a day or 5 pounds in a week.        Expected Discharge Plan: Home/Self Care Barriers to Discharge: No Barriers Identified   Patient Goals and CMS Choice        Expected Discharge Plan and Services Expected Discharge Plan: Home/Self Care   Discharge Planning Services: CM Consult   Living arrangements for the past 2 months: Single Family Home Expected Discharge Date: 03/29/20                                    Prior Living Arrangements/Services Living arrangements for the past 2 months: Single Family Home Lives with:: Spouse Patient language and need for interpreter reviewed:: Yes        Need for Family Participation in Patient Care: Yes (Comment) Care giver support system in place?: Yes (comment)   Criminal Activity/Legal Involvement Pertinent to Current Situation/Hospitalization: No - Comment as needed  Activities of Daily Living Home Assistive Devices/Equipment: None ADL Screening (condition at time of admission) Patient's cognitive ability adequate to safely complete daily activities?: Yes Is the patient deaf or have difficulty hearing?: No Does the patient have difficulty seeing, even when wearing glasses/contacts?: No Does the patient have difficulty concentrating, remembering, or making decisions?: No Patient able to express need for assistance with ADLs?: Yes Does the patient have difficulty dressing or  bathing?: No Independently performs ADLs?: Yes (appropriate for developmental age) Does the patient have difficulty walking or climbing stairs?: No Weakness of Legs: None Weakness of Arms/Hands: None  Permission Sought/Granted                  Emotional Assessment Appearance:: Appears stated age Attitude/Demeanor/Rapport: Engaged Affect (typically observed): Appropriate Orientation: : Oriented to Self, Oriented to Place, Oriented to  Time, Oriented to Situation Alcohol / Substance Use: Not Applicable Psych Involvement: No (comment)  Admission diagnosis:  Shortness of breath [R06.02] Hypokalemia [E87.6] Pericardial effusion [I31.3] Acute on chronic diastolic CHF (congestive heart failure) (HCC) [I50.33] Acute on chronic congestive heart failure, unspecified heart failure type ([place]) [I50.9] Patient Active Problem List   Diagnosis Date Noted  . Pericardial effusion   . OSA on CPAP   . Acute on chronic diastolic CHF (congestive heart failure) ([place]) 03/27/2020  . Hypokalemia   . Obesity, Class III, BMI 40-49.9 (morbid obesity) ([place])   . Congestive heart failure with left ventricular diastolic dysfunction, chronic ([place]) 04/02/2019  . Pneumonia 10/28/2018  . Screening for prostate cancer 04/29/2018  . Mixed hyperlipidemia 04/29/2018  . Cellulitis of lower extremity 04/29/2018  . Dysuria 04/29/2018  . Hypertension 11/26/2017  . Sleep apnea 11/26/2017   PCP:  Ronnell Freshwater, NP Pharmacy:   [facility] [address] [place] [facility] [place] [address] [place] [address] Phone: [phone] Fax: [phone]     Social Determinants of Health (SDOH)  Interventions    Readmission Risk Interventions No flowsheet data found.

## 2020-03-29 NOTE — Discharge Instructions (Signed)
advised to comply with diet and exercise

## 2020-04-01 LAB — CULTURE, BLOOD (ROUTINE X 2)
Culture: NO GROWTH
Culture: NO GROWTH
Special Requests: ADEQUATE
Special Requests: ADEQUATE

## 2020-04-02 ENCOUNTER — Telehealth: Payer: Self-pay | Admitting: Family

## 2020-04-02 NOTE — Telephone Encounter (Signed)
Unable to reach patient regarding his new patient CHF Clinic appointment that was made when he was discharged from the hospital.    Deetta Perla, NT

## 2020-04-03 NOTE — Progress Notes (Deleted)
   Patient ID: Bradley Velez, male    DOB: [birth year], 54 y.o.   MRN: [id number]  HPI  Bradley Velez is a 53 y/o male with a history of  Echo report from 03/27/20 reviewed and showed an EF of 35-40% along with mild LVH.   Admitted 03/27/20 due to acute on chronic HF. Initially needed IV lasix with transition to oral diuretics. Cardiology consult obtained. Abdominal ultrasound done due to pain which was showed gallstones. Discharged after 2 days.   He presents today for his initial visit with a chief complaint of   Review of Systems    Physical Exam  Assessment & Plan:  1: Chronic heart failure with reduced ejection fraction- - NYHA class - BNP 03/29/20 was 79.2  2: HTN- - BP - saw PCP ([facility]) 01/26/20 - BMP 03/29/20 reviewed and showed sodium 140, potassium 3.7, creatinine 0.9 and GFR >60  3: Obstructive sleep apnea- - saw pulmonology Juanda Bond) 01/06/2019

## 2020-04-05 ENCOUNTER — Ambulatory Visit: Payer: Self-pay | Admitting: Family

## 2020-04-05 ENCOUNTER — Telehealth: Payer: Self-pay | Admitting: Family

## 2020-04-05 NOTE — Telephone Encounter (Signed)
Patient did not show for his Heart Failure Clinic appointment on 04/05/20. Will attempt to reschedule.

## 2020-04-07 ENCOUNTER — Ambulatory Visit (INDEPENDENT_AMBULATORY_CARE_PROVIDER_SITE_OTHER): Payer: Self-pay | Admitting: Nurse Practitioner

## 2020-04-07 ENCOUNTER — Other Ambulatory Visit: Payer: Self-pay

## 2020-04-07 ENCOUNTER — Ambulatory Visit: Payer: Self-pay | Admitting: Nurse Practitioner

## 2020-04-07 ENCOUNTER — Encounter: Payer: Self-pay | Admitting: Nurse Practitioner

## 2020-04-07 VITALS — BP 106/72 | HR 82 | Temp 97.3°F | Resp 16 | Ht 65.0 in | Wt 303.2 lb

## 2020-04-07 DIAGNOSIS — G4733 Obstructive sleep apnea (adult) (pediatric): Secondary | ICD-10-CM

## 2020-04-07 DIAGNOSIS — Z09 Encounter for follow-up examination after completed treatment for conditions other than malignant neoplasm: Secondary | ICD-10-CM | POA: Insufficient documentation

## 2020-04-07 DIAGNOSIS — Z9989 Dependence on other enabling machines and devices: Secondary | ICD-10-CM

## 2020-04-07 DIAGNOSIS — E876 Hypokalemia: Secondary | ICD-10-CM

## 2020-04-07 DIAGNOSIS — I5033 Acute on chronic diastolic (congestive) heart failure: Secondary | ICD-10-CM

## 2020-04-07 DIAGNOSIS — I10 Essential (primary) hypertension: Secondary | ICD-10-CM

## 2020-04-07 NOTE — Progress Notes (Signed)
[facility] [address] [place], [place] [address]  Internal MEDICINE  Office Visit Note  Patient Name: Bradley Velez  [id number]  [id number]  Date of Service: 04/07/2020   The patient is here for transitional care visit after hospitalization.   Pt is here for recent hospital follow up.    Chief Complaint  Patient presents with  . Hospitalization Follow-up  . Hyperlipidemia  . Hypertension     The patient presents for hospital follow up. He was in the hospital from 02/25/2020 through 03/29/2020 due to exacerbation of congestive heart failure. Was given IV lasix as well as nebulizer treatments as he was having shortness of breath with any exertion. Echocardiogram, done during the hospitalization did show a reduction in LVEF to 39% with Grade ! Diastolic dysfunction. He does have sleep apnea and does use CPAP. He has not seen Dr. Freda Munro in some time due to financial concerns. He lost medical insurance when he was laid off during COVID 19 pandemic. No pneumonia was seen on chest x-ray or CT scan of the lungs. He did have mild fever upon admission. He was negative for COVID 19. Labs, which did show elevated BNP, decreased potassium and magnesium, were all normal prior to discharge.  He has been contacted per heart failure clinic to schedule a follow up. He is hesitant to contact and make appointment with them. He is afraid of spending money he does not have as he lost health insurance when he was laid off from his job due to COVID 19 pandemic.    Current Medication: Outpatient Encounter Medications as of 04/07/2020  Medication Sig  . albuterol (VENTOLIN HFA) 108 (90 Base) MCG/ACT inhaler Inhale 2 puffs into the lungs every 6 (six) hours as needed for wheezing or shortness of breath.  Marland Kitchen aspirin EC 81 MG EC tablet Take 1 tablet (81 mg total) by mouth daily.  . carvedilol (COREG) 6.25 MG tablet Take 1 tablet by mouth twice daily  . furosemide (LASIX) 80 MG tablet TAKE 1 TABLET  BY MOUTH ONCE DAILY  . ipratropium-albuterol (DUONEB) 0.5-2.5 (3) MG/3ML SOLN Take 3 mLs by nebulization every 4 (four) hours as needed.  Marland Kitchen lisinopril (ZESTRIL) 5 MG tablet Take 1 tablet (5 mg total) by mouth daily.  . rosuvastatin (CRESTOR) 10 MG tablet Take 1 tablet (10 mg total) by mouth daily.   No facility-administered encounter medications on file as of 04/07/2020.    Surgical History: Past Surgical History:  Procedure Laterality Date  . EYE SURGERY Bilateral 1970    Medical History: Past Medical History:  Diagnosis Date  . Hyperlipidemia   . Hypertension   . Obesity   . Sleep apnea     Family History: Family History  Problem Relation Age of Onset  . Emphysema Father   . Lung cancer Father     Social History   Socioeconomic History  . Marital status: Married    Spouse name: Not on file  . Number of children: Not on file  . Years of education: Not on file  . Highest education level: Not on file  Occupational History  . Not on file  Tobacco Use  . Smoking status: Never Smoker  . Smokeless tobacco: Never Used  Substance and Sexual Activity  . Alcohol use: Yes    Comment: social  . Drug use: No  . Sexual activity: Not on file  Other Topics Concern  . Not on file  Social History Narrative  . Not on file  Social Determinants of Health   Financial Resource Strain:   . Difficulty of Paying Living Expenses:   Food Insecurity:   . Worried About Programme researcher, broadcasting/film/video in the Last Year:   . Barista in the Last Year:   Transportation Needs:   . Freight forwarder (Medical):   Marland Kitchen Lack of Transportation (Non-Medical):   Physical Activity:   . Days of Exercise per Week:   . Minutes of Exercise per Session:   Stress:   . Feeling of Stress :   Social Connections:   . Frequency of Communication with Friends and Family:   . Frequency of Social Gatherings with Friends and Family:   . Attends Religious Services:   . Active Member of Clubs or  Organizations:   . Attends Banker Meetings:   Marland Kitchen Marital Status:   Intimate Partner Violence:   . Fear of Current or Ex-Partner:   . Emotionally Abused:   Marland Kitchen Physically Abused:   . Sexually Abused:       Review of Systems  Constitutional: Positive for activity change and fatigue. Negative for chills and unexpected weight change.  HENT: Negative for congestion, postnasal drip, rhinorrhea, sneezing and sore throat.   Respiratory: Positive for shortness of breath. Negative for cough and chest tightness.   Cardiovascular: Positive for leg swelling. Negative for chest pain and palpitations.       Blood pressure running low today.   Gastrointestinal: Negative for abdominal pain, constipation, diarrhea, nausea and vomiting.  Endocrine: Negative for cold intolerance, heat intolerance, polydipsia and polyuria.  Musculoskeletal: Negative for arthralgias, back pain, joint swelling and neck pain.  Skin: Negative for rash.  Allergic/Immunologic: Negative for environmental allergies.  Neurological: Negative for dizziness, tremors, numbness and headaches.  Hematological: Negative for adenopathy. Does not bruise/bleed easily.  Psychiatric/Behavioral: Negative for behavioral problems (Depression), sleep disturbance and suicidal ideas. The patient is not nervous/anxious.    Today's Vitals   04/07/20 0929  BP: 106/72  Pulse: 82  Resp: 16  Temp: (!) 97.3 F (36.3 C)  SpO2: 96%  Weight: (!) 303 lb 3.2 oz (137.5 kg)  Height: 5\' 5"  (1.651 m)   Body mass index is 50.46 kg/m.  Physical Exam Vitals and nursing note reviewed.  Constitutional:      General: He is not in acute distress.    Appearance: Normal appearance. He is well-developed. He is obese. He is not diaphoretic.  HENT:     Head: Normocephalic and atraumatic.     Nose: Nose normal.     Mouth/Throat:     Pharynx: No oropharyngeal exudate.  Eyes:     Pupils: Pupils are equal, round, and reactive to light.  Neck:      Thyroid: No thyromegaly.     Vascular: No JVD.     Trachea: No tracheal deviation.  Cardiovascular:     Rate and Rhythm: Normal rate and regular rhythm.     Heart sounds: Murmur heard.  No friction rub. No gallop.      Comments: The patient is wearing compression socks on both lower legs.  Pulmonary:     Effort: Pulmonary effort is normal. No respiratory distress.     Breath sounds: Normal breath sounds. No wheezing or rales.  Chest:     Chest wall: No tenderness.  Abdominal:     Palpations: Abdomen is soft.  Musculoskeletal:        General: Normal range of motion.     Cervical back:  Normal range of motion and neck supple.  Lymphadenopathy:     Cervical: No cervical adenopathy.  Skin:    General: Skin is warm and dry.  Neurological:     Mental Status: He is alert and oriented to person, place, and time.     Cranial Nerves: No cranial nerve deficit.  Psychiatric:        Mood and Affect: Mood normal.        Behavior: Behavior normal.        Thought Content: Thought content normal.        Judgment: Judgment normal.   Assessment/Plan:  1. Hospital discharge follow-up Patient recently hospitalized from 02/25/2020 through 03/29/2020 due to exacerbation of congestive heart failure. reviewed all progress notes, labs, and imaging studies with the patient.   2. Acute on chronic diastolic CHF (congestive heart failure) ([place]) Patient with exacerbation of CHF. He should follow up with cardiology and heart failure clinic as scheduled.   3. Essential hypertension Stable, even a bit on the low side today. Continue bp medications as prescribed. Follow up with cardiology as scheduled.   4. Hypokalemia Resolved prior to hospital discharge.   5. OSA on CPAP Patient should continue CPAP as prescribed. Will need follow up with Dr. Devona Konig for further evaluation.   General Counseling: braxten memmer understanding of the findings of todays visit and agrees with plan of treatment. I have  discussed any further diagnostic evaluation that may be needed or ordered today. We also reviewed his medications today. he has been encouraged to call the office with any questions or concerns that should arise related to todays visit.    Counseling:   Cardiac risk factor modification:  1. Control blood pressure. 2. Exercise as prescribed. 3. Follow low sodium, low fat diet. and low fat and low cholestrol diet. 4. Take ASA 81mg  once a day. 5. Restricted calories diet to lose weight.  This patient was seen by Leretha Pol FNP Collaboration with Dr Lavera Guise as a part of collaborative care agreement    I have reviewed all medical records from hospital follow up including radiology reports and consults from other physicians. Appropriate follow up diagnostics will be scheduled as needed. Patient/ Family understands the plan of treatment. Time spent 45 minutes.   Dr Lavera Guise, MD Internal Medicine

## 2020-07-27 ENCOUNTER — Ambulatory Visit: Payer: Self-pay | Admitting: Nurse Practitioner

## 2020-07-27 ENCOUNTER — Other Ambulatory Visit: Payer: Self-pay

## 2020-07-27 ENCOUNTER — Encounter: Payer: Self-pay | Admitting: Nurse Practitioner

## 2020-07-27 VITALS — BP 118/75 | HR 93 | Temp 97.8°F | Resp 16 | Ht 65.0 in | Wt 318.0 lb

## 2020-07-27 DIAGNOSIS — Z0001 Encounter for general adult medical examination with abnormal findings: Secondary | ICD-10-CM

## 2020-07-27 DIAGNOSIS — R0609 Other forms of dyspnea: Secondary | ICD-10-CM

## 2020-07-27 DIAGNOSIS — R300 Dysuria: Secondary | ICD-10-CM

## 2020-07-27 DIAGNOSIS — I10 Essential (primary) hypertension: Secondary | ICD-10-CM

## 2020-07-27 DIAGNOSIS — R0600 Dyspnea, unspecified: Secondary | ICD-10-CM

## 2020-07-27 DIAGNOSIS — E782 Mixed hyperlipidemia: Secondary | ICD-10-CM

## 2020-07-27 DIAGNOSIS — I5032 Chronic diastolic (congestive) heart failure: Secondary | ICD-10-CM

## 2020-07-27 MED ORDER — CARVEDILOL 6.25 MG PO TABS: ORAL_TABLET | ORAL | 1 refills | Status: DC

## 2020-07-27 MED ORDER — FUROSEMIDE 80 MG PO TABS: ORAL_TABLET | ORAL | 1 refills | Status: DC

## 2020-07-27 MED ORDER — ROSUVASTATIN CALCIUM 10 MG PO TABS: 10.0000 mg | ORAL_TABLET | Freq: Every day | ORAL | 1 refills | Status: DC

## 2020-07-27 NOTE — Progress Notes (Signed)
[facility] [address] [place], [place] [address]  Internal MEDICINE  Office Visit Note  Patient Name: Bradley Velez  [id number]  [id number]  Date of Service: 07/27/2020   Pt is here for routine health maintenance examination  Chief Complaint  Patient presents with  . Annual Exam  . Hyperlipidemia  . Hypertension  . Quality Metric Gaps    flu, tetnaus, Hep C  . other    controlled substance form given to PT     The patient is here for annual health maintenance exam. Today, the patient states that he is doing well. Blood pressure is well managed. He does see cardiology on routine visit. Will need to have routine visit with him after new insurance kicks in in December. Will give patient lab slip today. He should have routine, fasting labs done after new insurance is active. Will see him back to review. He should also have a repeat echocardiogram done. He does have history of congestive heart failure and diastolic dysfunction. He does prefer to do this study here and then see his cardiologist.     Current Medication: Outpatient Encounter Medications as of 07/27/2020  Medication Sig  . albuterol (VENTOLIN HFA) 108 (90 Base) MCG/ACT inhaler Inhale 2 puffs into the lungs every 6 (six) hours as needed for wheezing or shortness of breath.  Marland Kitchen aspirin EC 81 MG EC tablet Take 1 tablet (81 mg total) by mouth daily.  . carvedilol (COREG) 6.25 MG tablet Take 1 tablet by mouth twice daily  . furosemide (LASIX) 80 MG tablet TAKE 1 TABLET BY MOUTH ONCE DAILY  . ipratropium-albuterol (DUONEB) 0.5-2.5 (3) MG/3ML SOLN Take 3 mLs by nebulization every 4 (four) hours as needed.  . rosuvastatin (CRESTOR) 10 MG tablet Take 1 tablet (10 mg total) by mouth daily.  . [DISCONTINUED] carvedilol (COREG) 6.25 MG tablet Take 1 tablet by mouth twice daily  . [DISCONTINUED] furosemide (LASIX) 80 MG tablet TAKE 1 TABLET BY MOUTH ONCE DAILY  . [DISCONTINUED] lisinopril (ZESTRIL) 5 MG tablet Take 1  tablet (5 mg total) by mouth daily.  . [DISCONTINUED] rosuvastatin (CRESTOR) 10 MG tablet Take 1 tablet (10 mg total) by mouth daily.   No facility-administered encounter medications on file as of 07/27/2020.    Surgical History: Past Surgical History:  Procedure Laterality Date  . EYE SURGERY Bilateral 1970    Medical History: Past Medical History:  Diagnosis Date  . Hyperlipidemia   . Hypertension   . Obesity   . Sleep apnea     Family History: Family History  Problem Relation Age of Onset  . Emphysema Father   . Lung cancer Father       Review of Systems  Constitutional: Negative for activity change, chills, fatigue and unexpected weight change.  HENT: Negative for congestion, postnasal drip, rhinorrhea, sneezing and sore throat.   Respiratory: Negative for cough, chest tightness, shortness of breath and wheezing.   Cardiovascular: Positive for leg swelling. Negative for chest pain and palpitations.  Gastrointestinal: Negative for abdominal pain, constipation, diarrhea, nausea and vomiting.  Endocrine: Negative for cold intolerance, heat intolerance, polydipsia and polyuria.  Genitourinary: Negative for dysuria, frequency and urgency.  Musculoskeletal: Negative for arthralgias, back pain, joint swelling and neck pain.  Skin: Negative for rash.  Allergic/Immunologic: Negative for environmental allergies.  Neurological: Negative for dizziness, tremors, numbness and headaches.  Hematological: Negative for adenopathy. Does not bruise/bleed easily.  Psychiatric/Behavioral: Negative for behavioral problems (Depression), sleep disturbance and suicidal ideas. The patient is  not nervous/anxious.      Vital Signs: BP 118/75   Pulse 93   Temp 97.8 F (36.6 C)   Resp 16   Ht 5\' 5"  (1.651 m)   Wt (!) 318 lb (144.2 kg)   SpO2 95%   BMI 52.92 kg/m    Physical Exam Vitals and nursing note reviewed.  Constitutional:      General: He is not in acute distress.     Appearance: Normal appearance. He is well-developed. He is obese. He is not diaphoretic.  HENT:     Head: Normocephalic and atraumatic.     Nose: Nose normal.     Mouth/Throat:     Pharynx: No oropharyngeal exudate.  Eyes:     Pupils: Pupils are equal, round, and reactive to light.  Neck:     Thyroid: No thyromegaly.     Vascular: No carotid bruit or JVD.     Trachea: No tracheal deviation.  Cardiovascular:     Rate and Rhythm: Normal rate and regular rhythm.     Pulses: Normal pulses.     Heart sounds: Normal heart sounds. No murmur heard.  No friction rub. No gallop.   Pulmonary:     Effort: Pulmonary effort is normal. No respiratory distress.     Breath sounds: Normal breath sounds. No wheezing or rales.  Chest:     Chest wall: No tenderness.  Abdominal:     General: Bowel sounds are normal.     Palpations: Abdomen is soft.     Tenderness: There is no abdominal tenderness.  Musculoskeletal:        General: Normal range of motion.     Cervical back: Normal range of motion and neck supple.  Lymphadenopathy:     Cervical: No cervical adenopathy.  Skin:    General: Skin is warm and dry.  Neurological:     General: No focal deficit present.     Mental Status: He is alert and oriented to person, place, and time.     Cranial Nerves: No cranial nerve deficit.  Psychiatric:        Mood and Affect: Mood normal.        Behavior: Behavior normal.        Thought Content: Thought content normal.        Judgment: Judgment normal.    Assessment/Plan: 1. Encounter for general adult medical examination with abnormal findings Annual health maintenance exam today. Lab slip given to have routine, fasting labs checked.   2. Essential hypertension Continue coreg 6.25mg  twice daily/ refills provided today.  - carvedilol (COREG) 6.25 MG tablet; Take 1 tablet by mouth twice daily  Dispense: 180 tablet; Refill: 1  3. Congestive heart failure with left ventricular diastolic dysfunction,  chronic (HCC) Continue furosemide 80mg  daily. - furosemide (LASIX) 80 MG tablet; TAKE 1 TABLET BY MOUTH ONCE DAILY  Dispense: 90 tablet; Refill: 1 - ECHOCARDIOGRAM COMPLETE; Future  4. Dyspnea on exertion Will repeat echocardiogram prior to next visit and review at visit.  - ECHOCARDIOGRAM COMPLETE; Future  5. Mixed hyperlipidemia Check fasting labs. Adjust crestor dosing as indicated.  - rosuvastatin (CRESTOR) 10 MG tablet; Take 1 tablet (10 mg total) by mouth daily.  Dispense: 90 tablet; Refill: 1  6. Dysuria - UA/M w/rflx Culture, Routine  General Counseling: zebastian carico understanding of the findings of todays visit and agrees with plan of treatment. I have discussed any further diagnostic evaluation that may be needed or ordered today. We also reviewed his  medications today. he has been encouraged to call the office with any questions or concerns that should arise related to todays visit.    Counseling:  This patient was seen by Vincent Gros FNP Collaboration with Dr Lyndon Code as a part of collaborative care agreement  Orders Placed This Encounter  Procedures  . UA/M w/rflx Culture, Routine  . ECHOCARDIOGRAM COMPLETE    Meds ordered this encounter  Medications  . carvedilol (COREG) 6.25 MG tablet    Sig: Take 1 tablet by mouth twice daily    Dispense:  180 tablet    Refill:  1    Pt need appt for further refills    Order Specific Question:   Supervising Provider    Answer:   Lyndon Code [[id number]]  . furosemide (LASIX) 80 MG tablet    Sig: TAKE 1 TABLET BY MOUTH ONCE DAILY    Dispense:  90 tablet    Refill:  1    Order Specific Question:   Supervising Provider    Answer:   Lyndon Code [[id number]]  . rosuvastatin (CRESTOR) 10 MG tablet    Sig: Take 1 tablet (10 mg total) by mouth daily.    Dispense:  90 tablet    Refill:  1    Order Specific Question:   Supervising Provider    Answer:   Lyndon Code [[id number]]    Total time spent: 45 Minutes  Time spent  includes review of chart, medications, test results, and follow up plan with the patient.     Lyndon Code, MD  Internal Medicine

## 2020-07-28 LAB — UA/M W/RFLX CULTURE, ROUTINE
Bilirubin, UA: NEGATIVE
Glucose, UA: NEGATIVE
Ketones, UA: NEGATIVE
Leukocytes,UA: NEGATIVE
Nitrite, UA: NEGATIVE
Protein,UA: NEGATIVE
RBC, UA: NEGATIVE
Specific Gravity, UA: 1.009 (ref 1.005–1.030)
Urobilinogen, Ur: 0.2 mg/dL (ref 0.2–1.0)
pH, UA: 7 (ref 5.0–7.5)

## 2020-07-28 LAB — MICROSCOPIC EXAMINATION
Bacteria, UA: NONE SEEN
Casts: NONE SEEN /lpf
Epithelial Cells (non renal): NONE SEEN /hpf (ref 0–10)
RBC, Urine: NONE SEEN /hpf (ref 0–2)
WBC, UA: NONE SEEN /hpf (ref 0–5)

## 2020-10-27 ENCOUNTER — Other Ambulatory Visit: Payer: Self-pay

## 2020-10-29 ENCOUNTER — Other Ambulatory Visit: Payer: Self-pay

## 2020-11-05 ENCOUNTER — Ambulatory Visit: Payer: Self-pay | Admitting: Nurse Practitioner

## 2020-11-17 ENCOUNTER — Other Ambulatory Visit: Payer: Self-pay

## 2020-11-25 ENCOUNTER — Ambulatory Visit: Payer: Self-pay | Admitting: Hospice and Palliative Medicine

## 2020-11-29 ENCOUNTER — Telehealth: Payer: Self-pay

## 2020-11-29 NOTE — Telephone Encounter (Signed)
LMOM U/S appt canceled, waiting on tech.

## 2020-12-01 ENCOUNTER — Other Ambulatory Visit: Payer: Self-pay

## 2020-12-15 ENCOUNTER — Ambulatory Visit: Payer: Self-pay | Admitting: Hospice and Palliative Medicine

## 2021-04-04 ENCOUNTER — Ambulatory Visit
Admission: RE | Admit: 2021-04-04 | Discharge: 2021-04-04 | Disposition: A | Discharge status: Home / Self Care | Payer: BC Managed Care – PPO | Source: Ambulatory Visit | Attending: Physician Assistant | Admitting: Physician Assistant

## 2021-04-04 ENCOUNTER — Other Ambulatory Visit: Payer: Self-pay

## 2021-04-04 ENCOUNTER — Encounter: Payer: Self-pay | Admitting: Physician Assistant

## 2021-04-04 ENCOUNTER — Ambulatory Visit: Payer: 59 | Admitting: Physician Assistant

## 2021-04-04 DIAGNOSIS — M79605 Pain in left leg: Secondary | ICD-10-CM | POA: Diagnosis not present

## 2021-04-04 DIAGNOSIS — I5032 Chronic diastolic (congestive) heart failure: Secondary | ICD-10-CM

## 2021-04-04 DIAGNOSIS — E782 Mixed hyperlipidemia: Secondary | ICD-10-CM

## 2021-04-04 DIAGNOSIS — I10 Essential (primary) hypertension: Secondary | ICD-10-CM

## 2021-04-04 DIAGNOSIS — M79662 Pain in left lower leg: Secondary | ICD-10-CM | POA: Diagnosis not present

## 2021-04-04 DIAGNOSIS — L03116 Cellulitis of left lower limb: Secondary | ICD-10-CM | POA: Diagnosis not present

## 2021-04-04 DIAGNOSIS — M7989 Other specified soft tissue disorders: Secondary | ICD-10-CM | POA: Diagnosis not present

## 2021-04-04 MED ORDER — FUROSEMIDE 80 MG PO TABS: ORAL_TABLET | ORAL | 1 refills | Status: DC

## 2021-04-04 MED ORDER — CARVEDILOL 6.25 MG PO TABS: ORAL_TABLET | ORAL | 1 refills | Status: DC

## 2021-04-04 MED ORDER — CEPHALEXIN 500 MG PO CAPS: 500.0000 mg | ORAL_CAPSULE | Freq: Three times a day (TID) | ORAL | 0 refills | Status: DC

## 2021-04-04 MED ORDER — ROSUVASTATIN CALCIUM 10 MG PO TABS: 10.0000 mg | ORAL_TABLET | Freq: Every day | ORAL | 1 refills | Status: DC

## 2021-04-04 NOTE — Progress Notes (Signed)
[facility] [address] [place], [place] [address]  Internal MEDICINE  Office Visit Note  Patient Name: Bradley Velez  [id number]  [id number]  Date of Service: 04/05/2021  Chief Complaint  Patient presents with   Acute Visit    Left leg discoloration, back of calf tender,fever over the weekend     HPI Pt is here for a sick visit. -On Saturday started feeling bad and felt a little feverish, swelling of LLE really started Sunday, but worsening over night. Tender along the back of leg with redness and warmth on shin. Pt reports a hx of cellulitis and lymphedema in LE. Tenderness along back of calf is worrisome. Denies any changes in sensation.  Current Medication:  Outpatient Encounter Medications as of 04/04/2021  Medication Sig   albuterol (VENTOLIN HFA) 108 (90 Base) MCG/ACT inhaler Inhale 2 puffs into the lungs every 6 (six) hours as needed for wheezing or shortness of breath.   aspirin EC 81 MG EC tablet Take 1 tablet (81 mg total) by mouth daily.   cephALEXin (KEFLEX) 500 MG capsule Take 1 capsule (500 mg total) by mouth 3 (three) times daily.   ipratropium-albuterol (DUONEB) 0.5-2.5 (3) MG/3ML SOLN Take 3 mLs by nebulization every 4 (four) hours as needed.   [DISCONTINUED] carvedilol (COREG) 6.25 MG tablet Take 1 tablet by mouth twice daily   [DISCONTINUED] furosemide (LASIX) 80 MG tablet TAKE 1 TABLET BY MOUTH ONCE DAILY   [DISCONTINUED] rosuvastatin (CRESTOR) 10 MG tablet Take 1 tablet (10 mg total) by mouth daily.   carvedilol (COREG) 6.25 MG tablet Take 1 tablet by mouth twice daily   furosemide (LASIX) 80 MG tablet TAKE 1 TABLET BY MOUTH ONCE DAILY   rosuvastatin (CRESTOR) 10 MG tablet Take 1 tablet (10 mg total) by mouth daily.   No facility-administered encounter medications on file as of 04/04/2021.      Medical History: Past Medical History:  Diagnosis Date   Hyperlipidemia    Hypertension    Obesity    Sleep apnea      Vital Signs: BP (!)  146/94   Pulse 85   Temp 98.8 F (37.1 C)   Resp 16   Ht 5\' 6"  (1.676 m)   Wt (!) 364 lb 12.8 oz (165.5 kg)   SpO2 96%   BMI 58.88 kg/m    Review of Systems  Constitutional:  Negative for fatigue and fever.  HENT:  Negative for congestion, mouth sores and postnasal drip.   Respiratory:  Negative for cough.   Cardiovascular:  Positive for leg swelling. Negative for chest pain.  Genitourinary:  Negative for flank pain.  Skin:  Positive for color change.       Redness and swelling in left lower leg, tender on left calf  Psychiatric/Behavioral: Negative.     Physical Exam Vitals and nursing note reviewed.  Constitutional:      General: He is not in acute distress.    Appearance: He is well-developed. He is obese. He is not diaphoretic.  HENT:     Head: Normocephalic and atraumatic.     Mouth/Throat:     Pharynx: No oropharyngeal exudate.  Eyes:     Pupils: Pupils are equal, round, and reactive to light.  Neck:     Thyroid: No thyromegaly.     Vascular: No JVD.     Trachea: No tracheal deviation.  Cardiovascular:     Rate and Rhythm: Normal rate and regular rhythm.     Heart sounds: Normal  heart sounds. No murmur heard.   No friction rub. No gallop.  Pulmonary:     Effort: Pulmonary effort is normal. No respiratory distress.     Breath sounds: No wheezing or rales.  Chest:     Chest wall: No tenderness.  Abdominal:     General: Bowel sounds are normal.     Palpations: Abdomen is soft.  Musculoskeletal:        General: Normal range of motion.     Cervical back: Normal range of motion and neck supple.     Left lower leg: Edema present.  Lymphadenopathy:     Cervical: No cervical adenopathy.  Skin:    General: Skin is warm and dry.     Findings: Erythema present.     Comments: Erythema and warmth across left shin, but tenderness upon palpation of calf/back of leg. Significant edema present.  Neurological:     Mental Status: He is alert and oriented to person,  place, and time.     Cranial Nerves: No cranial nerve deficit.  Psychiatric:        Behavior: Behavior normal.        Thought Content: Thought content normal.        Judgment: Judgment normal.      Assessment/Plan: 1. Pain and swelling of lower extremity, left Will obtain ultrasound to rule out DVT in left lower extremity, patient will also be started on Keflex 3 times a day due to concern of cellulitis. Instructed to go to ED if acute worsening. - US Venous Img Lower Unilateral Left (DVT); Future - cephALEXin (KEFLEX) 500 MG capsule; Take 1 capsule (500 mg total) by mouth 3 (three) times daily.  Dispense: 21 capsule; Refill: 0  2. Cellulitis of left lower extremity Start on Keflex 3 times a day due to concern of cellulitis.  Patient is aware that if not improving on oral antibiotics or if acute worsening will need to go to hospital for likely IV administration - cephALEXin (KEFLEX) 500 MG capsule; Take 1 capsule (500 mg total) by mouth 3 (three) times daily.  Dispense: 21 capsule; Refill: 0  3. Essential hypertension Stable, requesting refill - carvedilol (COREG) 6.25 MG tablet; Take 1 tablet by mouth twice daily  Dispense: 180 tablet; Refill: 1  4. Congestive heart failure with left ventricular diastolic dysfunction, chronic (HCC) Requesting refill of Lasix - furosemide (LASIX) 80 MG tablet; TAKE 1 TABLET BY MOUTH ONCE DAILY  Dispense: 90 tablet; Refill: 1  5. Mixed hyperlipidemia Requesting refill of Crestor - rosuvastatin (CRESTOR) 10 MG tablet; Take 1 tablet (10 mg total) by mouth daily.  Dispense: 90 tablet; Refill: 1   General Counseling: rance smithson understanding of the findings of todays visit and agrees with plan of treatment. I have discussed any further diagnostic evaluation that may be needed or ordered today. We also reviewed his medications today. he has been encouraged to call the office with any questions or concerns that should arise related to todays  visit.    Counseling:    Orders Placed This Encounter  Procedures   US Venous Img Lower Unilateral Left (DVT)    Meds ordered this encounter  Medications   carvedilol (COREG) 6.25 MG tablet    Sig: Take 1 tablet by mouth twice daily    Dispense:  180 tablet    Refill:  1    Pt need appt for further refills   furosemide (LASIX) 80 MG tablet    Sig: TAKE 1 TABLET BY  MOUTH ONCE DAILY    Dispense:  90 tablet    Refill:  1   rosuvastatin (CRESTOR) 10 MG tablet    Sig: Take 1 tablet (10 mg total) by mouth daily.    Dispense:  90 tablet    Refill:  1   cephALEXin (KEFLEX) 500 MG capsule    Sig: Take 1 capsule (500 mg total) by mouth 3 (three) times daily.    Dispense:  21 capsule    Refill:  0    Time spent:30 Minutes

## 2021-04-11 ENCOUNTER — Other Ambulatory Visit: Payer: Self-pay

## 2021-04-11 DIAGNOSIS — M79605 Pain in left leg: Secondary | ICD-10-CM

## 2021-04-11 DIAGNOSIS — M7989 Other specified soft tissue disorders: Secondary | ICD-10-CM

## 2021-04-11 DIAGNOSIS — L03116 Cellulitis of left lower limb: Secondary | ICD-10-CM

## 2021-04-11 MED ORDER — CEPHALEXIN 500 MG PO CAPS: 500.0000 mg | ORAL_CAPSULE | Freq: Three times a day (TID) | ORAL | 0 refills | Status: DC

## 2021-04-11 NOTE — Telephone Encounter (Signed)
Pt called that he need another around of antibiotic he said is cellulitis is improving less red and also advised him that we send one round of antibiotic but he is not feeling better need to go ED may  need IVY antibiotic as per lauren

## 2021-06-06 DIAGNOSIS — E876 Hypokalemia: Secondary | ICD-10-CM | POA: Diagnosis not present

## 2021-06-06 DIAGNOSIS — R079 Chest pain, unspecified: Secondary | ICD-10-CM | POA: Diagnosis not present

## 2021-06-06 DIAGNOSIS — J069 Acute upper respiratory infection, unspecified: Secondary | ICD-10-CM | POA: Diagnosis not present

## 2021-06-06 DIAGNOSIS — K8020 Calculus of gallbladder without cholecystitis without obstruction: Secondary | ICD-10-CM | POA: Diagnosis not present

## 2021-06-06 DIAGNOSIS — E785 Hyperlipidemia, unspecified: Secondary | ICD-10-CM | POA: Diagnosis not present

## 2021-06-06 DIAGNOSIS — I080 Rheumatic disorders of both mitral and aortic valves: Secondary | ICD-10-CM | POA: Diagnosis not present

## 2021-06-06 DIAGNOSIS — I890 Lymphedema, not elsewhere classified: Secondary | ICD-10-CM | POA: Diagnosis not present

## 2021-06-06 DIAGNOSIS — R0602 Shortness of breath: Secondary | ICD-10-CM | POA: Diagnosis not present

## 2021-06-06 DIAGNOSIS — R0609 Other forms of dyspnea: Secondary | ICD-10-CM | POA: Diagnosis not present

## 2021-06-06 DIAGNOSIS — I5020 Unspecified systolic (congestive) heart failure: Secondary | ICD-10-CM | POA: Diagnosis not present

## 2021-06-06 DIAGNOSIS — I509 Heart failure, unspecified: Secondary | ICD-10-CM | POA: Diagnosis not present

## 2021-06-06 DIAGNOSIS — Z79899 Other long term (current) drug therapy: Secondary | ICD-10-CM | POA: Diagnosis not present

## 2021-06-06 DIAGNOSIS — I110 Hypertensive heart disease with heart failure: Secondary | ICD-10-CM | POA: Diagnosis not present

## 2021-06-06 DIAGNOSIS — R000 Tachycardia, unspecified: Secondary | ICD-10-CM | POA: Diagnosis not present

## 2021-06-06 DIAGNOSIS — K760 Fatty (change of) liver, not elsewhere classified: Secondary | ICD-10-CM | POA: Diagnosis not present

## 2021-06-06 DIAGNOSIS — G4733 Obstructive sleep apnea (adult) (pediatric): Secondary | ICD-10-CM | POA: Diagnosis not present

## 2021-06-06 DIAGNOSIS — R6889 Other general symptoms and signs: Secondary | ICD-10-CM | POA: Diagnosis not present

## 2021-06-06 DIAGNOSIS — Z6841 Body Mass Index (BMI) 40.0 and over, adult: Secondary | ICD-10-CM | POA: Diagnosis not present

## 2021-06-06 DIAGNOSIS — I5043 Acute on chronic combined systolic (congestive) and diastolic (congestive) heart failure: Secondary | ICD-10-CM | POA: Diagnosis not present

## 2021-06-06 DIAGNOSIS — R911 Solitary pulmonary nodule: Secondary | ICD-10-CM | POA: Diagnosis not present

## 2021-06-06 DIAGNOSIS — Z20822 Contact with and (suspected) exposure to covid-19: Secondary | ICD-10-CM | POA: Diagnosis not present

## 2021-06-06 DIAGNOSIS — I517 Cardiomegaly: Secondary | ICD-10-CM | POA: Diagnosis not present

## 2021-06-06 DIAGNOSIS — E669 Obesity, unspecified: Secondary | ICD-10-CM | POA: Diagnosis not present

## 2021-06-10 ENCOUNTER — Telehealth: Payer: Self-pay

## 2021-06-10 DIAGNOSIS — E876 Hypokalemia: Secondary | ICD-10-CM | POA: Diagnosis not present

## 2021-06-10 DIAGNOSIS — I5023 Acute on chronic systolic (congestive) heart failure: Secondary | ICD-10-CM | POA: Diagnosis not present

## 2021-06-10 DIAGNOSIS — Z79899 Other long term (current) drug therapy: Secondary | ICD-10-CM | POA: Diagnosis not present

## 2021-06-10 DIAGNOSIS — N179 Acute kidney failure, unspecified: Secondary | ICD-10-CM | POA: Diagnosis not present

## 2021-06-10 NOTE — Telephone Encounter (Signed)
Left vm for patient to return my call to schedule hospital f/u-Toni

## 2021-06-14 ENCOUNTER — Telehealth: Payer: Self-pay

## 2021-06-14 NOTE — Telephone Encounter (Signed)
error 

## 2021-06-14 NOTE — Telephone Encounter (Signed)
Left vm for patient to schedule hospital f/u-Bradley Velez

## 2021-06-15 NOTE — Telephone Encounter (Signed)
Left vm to schedule hospital f/u

## 2021-06-16 NOTE — Telephone Encounter (Signed)
Sent patient message to call me.

## 2021-07-29 ENCOUNTER — Encounter: Payer: 59 | Admitting: Nurse Practitioner

## 2021-11-21 ENCOUNTER — Ambulatory Visit: Payer: 59 | Admitting: Physician Assistant

## 2021-11-21 ENCOUNTER — Other Ambulatory Visit: Payer: Self-pay

## 2021-11-21 ENCOUNTER — Encounter: Payer: Self-pay | Admitting: Physician Assistant

## 2021-11-21 DIAGNOSIS — Z9989 Dependence on other enabling machines and devices: Secondary | ICD-10-CM

## 2021-11-21 DIAGNOSIS — I10 Essential (primary) hypertension: Secondary | ICD-10-CM | POA: Diagnosis not present

## 2021-11-21 DIAGNOSIS — Z125 Encounter for screening for malignant neoplasm of prostate: Secondary | ICD-10-CM

## 2021-11-21 DIAGNOSIS — E782 Mixed hyperlipidemia: Secondary | ICD-10-CM | POA: Diagnosis not present

## 2021-11-21 DIAGNOSIS — G4733 Obstructive sleep apnea (adult) (pediatric): Secondary | ICD-10-CM

## 2021-11-21 DIAGNOSIS — I5032 Chronic diastolic (congestive) heart failure: Secondary | ICD-10-CM | POA: Diagnosis not present

## 2021-11-21 DIAGNOSIS — R7989 Other specified abnormal findings of blood chemistry: Secondary | ICD-10-CM

## 2021-11-21 DIAGNOSIS — R5383 Other fatigue: Secondary | ICD-10-CM

## 2021-11-21 DIAGNOSIS — R600 Localized edema: Secondary | ICD-10-CM

## 2021-11-21 MED ORDER — CARVEDILOL 6.25 MG PO TABS: ORAL_TABLET | ORAL | 1 refills | Status: DC

## 2021-11-21 MED ORDER — ROSUVASTATIN CALCIUM 10 MG PO TABS: 10.0000 mg | ORAL_TABLET | Freq: Every day | ORAL | 1 refills | Status: DC

## 2021-11-21 MED ORDER — FUROSEMIDE 80 MG PO TABS: ORAL_TABLET | ORAL | 1 refills | Status: DC

## 2021-11-21 NOTE — Progress Notes (Signed)
[facility] [address] [place], [place] [address]  Internal MEDICINE  Office Visit Note  Patient Name: Bradley Velez  [id number]  [id number]  Date of Service: 11/21/2021  Chief Complaint  Patient presents with   Follow-up   Hyperlipidemia   Hypertension   Medication Refill    Needs Furosemide, Rosouvastatin and Carvedilol    HPI Pt is here for routine follow up and has not been seen in office in awhile -previously leg swells and goes down at night, however left leg isnt going down as much at night now and has been staying more swollen chronically. He has not been back to see cardiology in a long time due to living far away and will schedule follow up now. He may also benefit from vascular  -Has been taking his lasix and some days leads to urinating a lot and other days not very much. He tries to stay well hydrated.   -BP not checked at home, has not had lasix yet today due to long drive here. Will start checking BP at home now.  -Has CPE coming up next month and will have routine labs done today since it is difficult for him to get to lab and is already our this way. He did have OJ about 4 hours ago but nothing else to eat or drink, may have elevated glucose due to this. -Discussed that I want to evaluate kidney function and electrolytes with high dose lasix use. Denies having any problems with kidneys in past  Current Medication: Outpatient Encounter Medications as of 11/21/2021  Medication Sig   albuterol (VENTOLIN HFA) 108 (90 Base) MCG/ACT inhaler Inhale 2 puffs into the lungs every 6 (six) hours as needed for wheezing or shortness of breath.   aspirin EC 81 MG EC tablet Take 1 tablet (81 mg total) by mouth daily.   cephALEXin (KEFLEX) 500 MG capsule Take 1 capsule (500 mg total) by mouth 3 (three) times daily.   ipratropium-albuterol (DUONEB) 0.5-2.5 (3) MG/3ML SOLN Take 3 mLs by nebulization every 4 (four) hours as needed.   [DISCONTINUED] carvedilol (COREG) 6.25  MG tablet Take 1 tablet by mouth twice daily   [DISCONTINUED] furosemide (LASIX) 80 MG tablet TAKE 1 TABLET BY MOUTH ONCE DAILY   [DISCONTINUED] rosuvastatin (CRESTOR) 10 MG tablet Take 1 tablet (10 mg total) by mouth daily.   carvedilol (COREG) 6.25 MG tablet Take 1 tablet by mouth twice daily   furosemide (LASIX) 80 MG tablet TAKE 1 TABLET BY MOUTH ONCE DAILY   rosuvastatin (CRESTOR) 10 MG tablet Take 1 tablet (10 mg total) by mouth daily.   No facility-administered encounter medications on file as of 11/21/2021.    Surgical History: Past Surgical History:  Procedure Laterality Date   EYE SURGERY Bilateral 1970    Medical History: Past Medical History:  Diagnosis Date   Hyperlipidemia    Hypertension    Obesity    Sleep apnea     Family History: Family History  Problem Relation Age of Onset   Emphysema Father    Lung cancer Father     Social History   Socioeconomic History   Marital status: Married    Spouse name: Not on file   Number of children: Not on file   Years of education: Not on file   Highest education level: Not on file  Occupational History   Not on file  Tobacco Use   Smoking status: Never   Smokeless tobacco: Never  Substance and Sexual Activity  Alcohol use: Yes    Comment: social   Drug use: No   Sexual activity: Not on file  Other Topics Concern   Not on file  Social History Narrative   Not on file   Social Determinants of Health   Financial Resource Strain: Not on file  Food Insecurity: Not on file  Transportation Needs: Not on file  Physical Activity: Not on file  Stress: Not on file  Social Connections: Not on file  Intimate Partner Violence: Not on file      Review of Systems  Constitutional:  Negative for activity change, chills, fatigue and unexpected weight change.  HENT:  Negative for congestion, postnasal drip, rhinorrhea, sneezing and sore throat.   Respiratory:  Negative for cough, chest tightness, shortness of  breath and wheezing.   Cardiovascular:  Positive for leg swelling. Negative for chest pain and palpitations.  Gastrointestinal:  Negative for abdominal pain, constipation, diarrhea, nausea and vomiting.  Endocrine: Negative for cold intolerance, heat intolerance, polydipsia and polyuria.  Genitourinary:  Negative for dysuria, frequency and urgency.  Musculoskeletal:  Negative for arthralgias, back pain, joint swelling and neck pain.  Skin:  Negative for rash.  Allergic/Immunologic: Negative for environmental allergies.  Neurological:  Negative for dizziness, tremors, numbness and headaches.  Hematological:  Negative for adenopathy. Does not bruise/bleed easily.  Psychiatric/Behavioral:  Negative for behavioral problems (Depression), sleep disturbance and suicidal ideas. The patient is not nervous/anxious.    Vital Signs: BP (!) 144/92 Comment: 147/88   Pulse 90    Temp 98.4 F (36.9 C)    Resp 16    Ht 5\' 7"  (1.702 m)    Wt (!) 387 lb 6.4 oz (175.7 kg)    SpO2 93%    BMI 60.68 kg/m    Physical Exam Vitals and nursing note reviewed.  Constitutional:      General: He is not in acute distress.    Appearance: Normal appearance. He is well-developed. He is obese. He is not diaphoretic.  HENT:     Head: Normocephalic and atraumatic.     Nose: Nose normal.     Mouth/Throat:     Pharynx: No oropharyngeal exudate.  Eyes:     Pupils: Pupils are equal, round, and reactive to light.  Neck:     Thyroid: No thyromegaly.     Vascular: No carotid bruit or JVD.     Trachea: No tracheal deviation.  Cardiovascular:     Rate and Rhythm: Normal rate and regular rhythm.     Pulses: Normal pulses.     Heart sounds: Normal heart sounds. No murmur heard.   No friction rub. No gallop.  Pulmonary:     Effort: Pulmonary effort is normal. No respiratory distress.     Breath sounds: Normal breath sounds. No wheezing or rales.  Chest:     Chest wall: No tenderness.  Abdominal:     General: Bowel sounds  are normal.     Palpations: Abdomen is soft.     Tenderness: There is no abdominal tenderness.  Musculoskeletal:        General: Normal range of motion.     Cervical back: Normal range of motion and neck supple.     Right lower leg: Edema present.     Left lower leg: Edema present.  Lymphadenopathy:     Cervical: No cervical adenopathy.  Skin:    General: Skin is warm and dry.  Neurological:     General: No focal deficit present.  Mental Status: He is alert and oriented to person, place, and time.     Cranial Nerves: No cranial nerve deficit.  Psychiatric:        Mood and Affect: Mood normal.        Behavior: Behavior normal.        Thought Content: Thought content normal.        Judgment: Judgment normal.       Assessment/Plan: 1. Essential hypertension Mildly elevated in office, but has not taken lasix yet today. May continue current medications and advised to start monitoring BP at home - carvedilol (COREG) 6.25 MG tablet; Take 1 tablet by mouth twice daily  Dispense: 180 tablet; Refill: 1  2. Congestive heart failure with left ventricular diastolic dysfunction, chronic (HCC) Advised to schedule follow up with cardiology for further evaluation given increased LE swelling despite lasix. - furosemide (LASIX) 80 MG tablet; TAKE 1 TABLET BY MOUTH ONCE DAILY  Dispense: 90 tablet; Refill: 1  3. Lower extremity edema Will continue to elevated leg and take lasix as prescribed. Will follow up with cardiology and may need to consider vascular if still problematic  4. Mixed hyperlipidemia Will continue crestor and update labs - Lipid Panel With LDL/HDL Ratio - rosuvastatin (CRESTOR) 10 MG tablet; Take 1 tablet (10 mg total) by mouth daily.  Dispense: 90 tablet; Refill: 1  5. OSA on CPAP Continue cpap nightly  6. Abnormal thyroid blood test - TSH + free T4  7. Screening for prostate cancer - PSA Total (Reflex To Free)  8. Other fatigue - CBC w/Diff/Platelet -  Comprehensive metabolic panel - TSH + free T4 - Lipid Panel With LDL/HDL Ratio   General Counseling: Hitoshi verbalizes understanding of the findings of todays visit and agrees with plan of treatment. I have discussed any further diagnostic evaluation that may be needed or ordered today. We also reviewed his medications today. he has been encouraged to call the office with any questions or concerns that should arise related to todays visit.    Orders Placed This Encounter  Procedures   CBC w/Diff/Platelet   Comprehensive metabolic panel   TSH + free T4   Lipid Panel With LDL/HDL Ratio   PSA Total (Reflex To Free)    Meds ordered this encounter  Medications   rosuvastatin (CRESTOR) 10 MG tablet    Sig: Take 1 tablet (10 mg total) by mouth daily.    Dispense:  90 tablet    Refill:  1   carvedilol (COREG) 6.25 MG tablet    Sig: Take 1 tablet by mouth twice daily    Dispense:  180 tablet    Refill:  1    Pt need appt for further refills   furosemide (LASIX) 80 MG tablet    Sig: TAKE 1 TABLET BY MOUTH ONCE DAILY    Dispense:  90 tablet    Refill:  1    This patient was seen by Lynn Ito, PA-C in collaboration with Dr. Beverely Risen as a part of collaborative care agreement.   Total time spent:30 Minutes Time spent includes review of chart, medications, test results, and follow up plan with the patient.      Dr Lyndon Code Internal medicine

## 2021-12-01 ENCOUNTER — Encounter: Payer: 59 | Admitting: Physician Assistant

## 2021-12-10 LAB — LIPID PANEL WITH LDL/HDL RATIO
Cholesterol, Total: 111 mg/dL (ref 100–199)
HDL: 31 mg/dL — ABNORMAL LOW (ref >39)
LDL Chol Calc (NIH): 45 mg/dL (ref 0–99)
LDL/HDL Ratio: 1.5 ratio (ref 0.0–3.6)
Triglycerides: 216 mg/dL — ABNORMAL HIGH (ref 0–149)
VLDL Cholesterol Cal: 35 mg/dL (ref 5–40)

## 2021-12-10 LAB — COMPREHENSIVE METABOLIC PANEL
ALT: 24 IU/L (ref 0–44)
AST: 30 IU/L (ref 0–40)
Albumin/Globulin Ratio: 1.3 (ref 1.2–2.2)
Albumin: 4.1 g/dL (ref 3.8–4.9)
Alkaline Phosphatase: 143 IU/L — ABNORMAL HIGH (ref 44–121)
BUN/Creatinine Ratio: 19 (ref 9–20)
BUN: 18 mg/dL (ref 6–24)
Bilirubin Total: 0.4 mg/dL (ref 0.0–1.2)
CO2: 26 mmol/L (ref 20–29)
Calcium: 9.6 mg/dL (ref 8.7–10.2)
Chloride: 102 mmol/L (ref 96–106)
Creatinine, Ser: 0.96 mg/dL (ref 0.76–1.27)
Globulin, Total: 3.1 g/dL (ref 1.5–4.5)
Glucose: 118 mg/dL — ABNORMAL HIGH (ref 70–99)
Potassium: 3.7 mmol/L (ref 3.5–5.2)
Sodium: 141 mmol/L (ref 134–144)
Total Protein: 7.2 g/dL (ref 6.0–8.5)
eGFR: 93 mL/min/{1.73_m2} (ref >59)

## 2021-12-10 LAB — CBC WITH DIFFERENTIAL/PLATELET
Basophils Absolute: 0.1 10*3/uL (ref 0.0–0.2)
Basos: 1 %
EOS (ABSOLUTE): 0.2 10*3/uL (ref 0.0–0.4)
Eos: 2 %
Hematocrit: 41.9 % (ref 37.5–51.0)
Hemoglobin: 13.8 g/dL (ref 13.0–17.7)
Immature Grans (Abs): 0 10*3/uL (ref 0.0–0.1)
Immature Granulocytes: 0 %
Lymphocytes Absolute: 2.7 10*3/uL (ref 0.7–3.1)
Lymphs: 33 %
MCH: 31.9 pg (ref 26.6–33.0)
MCHC: 32.9 g/dL (ref 31.5–35.7)
MCV: 97 fL (ref 79–97)
Monocytes Absolute: 1.1 10*3/uL — ABNORMAL HIGH (ref 0.1–0.9)
Monocytes: 14 %
Neutrophils Absolute: 3.9 10*3/uL (ref 1.4–7.0)
Neutrophils: 50 %
Platelets: 251 10*3/uL (ref 150–450)
RBC: 4.33 x10E6/uL (ref 4.14–5.80)
RDW: 13.1 % (ref 11.6–15.4)
WBC: 8 10*3/uL (ref 3.4–10.8)

## 2021-12-10 LAB — PSA TOTAL (REFLEX TO FREE): Prostate Specific Ag, Serum: 2.6 ng/mL (ref 0.0–4.0)

## 2021-12-10 LAB — TSH+FREE T4
Free T4: 1.15 ng/dL (ref 0.82–1.77)
TSH: 2.7 u[IU]/mL (ref 0.450–4.500)

## 2021-12-21 ENCOUNTER — Telehealth: Payer: Self-pay

## 2021-12-21 NOTE — Telephone Encounter (Signed)
Left vm and sent mychart message to confirm 12/26/21 appointment-Toni ?

## 2021-12-26 ENCOUNTER — Ambulatory Visit (INDEPENDENT_AMBULATORY_CARE_PROVIDER_SITE_OTHER): Payer: 59 | Admitting: Physician Assistant

## 2021-12-26 ENCOUNTER — Encounter: Payer: Self-pay | Admitting: Physician Assistant

## 2021-12-26 ENCOUNTER — Other Ambulatory Visit: Payer: Self-pay

## 2021-12-26 DIAGNOSIS — R7301 Impaired fasting glucose: Secondary | ICD-10-CM

## 2021-12-26 DIAGNOSIS — R300 Dysuria: Secondary | ICD-10-CM

## 2021-12-26 DIAGNOSIS — R600 Localized edema: Secondary | ICD-10-CM

## 2021-12-26 DIAGNOSIS — Z0001 Encounter for general adult medical examination with abnormal findings: Secondary | ICD-10-CM

## 2021-12-26 DIAGNOSIS — I10 Essential (primary) hypertension: Secondary | ICD-10-CM

## 2021-12-26 DIAGNOSIS — Z6841 Body Mass Index (BMI) 40.0 and over, adult: Secondary | ICD-10-CM

## 2021-12-26 DIAGNOSIS — I5032 Chronic diastolic (congestive) heart failure: Secondary | ICD-10-CM | POA: Diagnosis not present

## 2021-12-26 DIAGNOSIS — E782 Mixed hyperlipidemia: Secondary | ICD-10-CM

## 2021-12-26 LAB — POCT GLYCOSYLATED HEMOGLOBIN (HGB A1C): Hemoglobin A1C: 6.2 % — AB (ref 4.0–5.6)

## 2021-12-26 MED ORDER — RYBELSUS 3 MG PO TABS
3.0000 mg | ORAL_TABLET | Freq: Every day | ORAL | 2 refills | Status: DC
Start: 2021-12-26 — End: 2022-03-12

## 2021-12-26 NOTE — Patient Instructions (Signed)

## 2021-12-26 NOTE — Progress Notes (Cosign Needed)
[facility] [place], [place] [address]  Internal MEDICINE  Office Visit Note  Patient Name: Bradley Velez  [id number]  [id number]  Date of Service: 12/26/2021  Chief Complaint  Patient presents with   Annual Exam   Hyperlipidemia   Hypertension     HPI Pt is here for routine health maintenance examination -After last visit we discussed having him follow-up with [facility], cardiology, however he has not yet called and will do so now. -His lower extremity swelling continues to be about the same.  He continues to take his Lasix as prescribed.  He does have a history of lymphedema and was seen by lymphedema clinic around 2004-05 for leg wrapping.  He now just wears compression stockings and is not interested in going back to lymphedema clinic at this time.  States he is really working on an effort to lose weight as he noticed that his legs have increased in size since gaining weight -he states his Insurance will only fill 30 day supplies now unfortunately -Reviewed labs-overall look good but he does have an elevated glucose at 118 and therefore an A1c was done in office today and was in prediabetic range.  He was given a Rybelsus sample pack in office with a prescription sent in case prior Auth necessary.  Go ahead and start on this to help with reducing blood sugars as well as helping with weight loss management.  Additionally does have mildly elevated alk phos we will continue to monitor this.  His triglycerides continue to be elevated however his LDL and overall cholesterol are well controlled we will continue on current medication. -He requests to hold off on colonoscopy until this summer as he does not currently have someone who can drive him.  He is aware that he is very overdue for this  Current Medication: Outpatient Encounter Medications as of 12/26/2021  Medication Sig   albuterol (VENTOLIN HFA) 108 (90 Base) MCG/ACT inhaler Inhale 2 puffs into the lungs  every 6 (six) hours as needed for wheezing or shortness of breath.   aspirin EC 81 MG EC tablet Take 1 tablet (81 mg total) by mouth daily.   carvedilol (COREG) 6.25 MG tablet Take 1 tablet by mouth twice daily   cephALEXin (KEFLEX) 500 MG capsule Take 1 capsule (500 mg total) by mouth 3 (three) times daily.   furosemide (LASIX) 80 MG tablet TAKE 1 TABLET BY MOUTH ONCE DAILY   ipratropium-albuterol (DUONEB) 0.5-2.5 (3) MG/3ML SOLN Take 3 mLs by nebulization every 4 (four) hours as needed.   rosuvastatin (CRESTOR) 10 MG tablet Take 1 tablet (10 mg total) by mouth daily.   Semaglutide (RYBELSUS) 3 MG TABS Take 3 mg by mouth daily.   No facility-administered encounter medications on file as of 12/26/2021.    Surgical History: Past Surgical History:  Procedure Laterality Date   EYE SURGERY Bilateral 1970    Medical History: Past Medical History:  Diagnosis Date   Hyperlipidemia    Hypertension    Obesity    Sleep apnea     Family History: Family History  Problem Relation Age of Onset   Emphysema Father    Lung cancer Father       Review of Systems  Constitutional:  Negative for activity change, chills, fatigue and unexpected weight change.  HENT:  Negative for congestion, postnasal drip, rhinorrhea, sneezing and sore throat.   Respiratory:  Negative for cough, chest tightness, shortness of breath and wheezing.   Cardiovascular:  Positive  for leg swelling. Negative for chest pain and palpitations.  Gastrointestinal:  Negative for abdominal pain, constipation, diarrhea, nausea and vomiting.  Endocrine: Negative for cold intolerance, heat intolerance, polydipsia and polyuria.  Genitourinary:  Negative for dysuria, frequency and urgency.  Musculoskeletal:  Negative for arthralgias, back pain, joint swelling and neck pain.  Skin:  Negative for rash.  Allergic/Immunologic: Negative for environmental allergies.  Neurological:  Negative for dizziness, tremors, numbness and headaches.   Hematological:  Negative for adenopathy. Does not bruise/bleed easily.  Psychiatric/Behavioral:  Negative for behavioral problems (Depression), sleep disturbance and suicidal ideas. The patient is not nervous/anxious.     Vital Signs: BP 123/74    Pulse 69    Temp 98.4 F (36.9 C)    Resp 16    Ht _0  (1.702 m)    Wt (!) 378 lb 12.8 oz (171.8 kg)    SpO2 99%    BMI 59.33 kg/m    Physical Exam Vitals and nursing note reviewed.  Constitutional:      General: He is not in acute distress.    Appearance: Normal appearance. He is well-developed. He is obese. He is not diaphoretic.  HENT:     Head: Normocephalic and atraumatic.     Nose: Nose normal.     Mouth/Throat:     Pharynx: No oropharyngeal exudate.  Eyes:     Pupils: Pupils are equal, round, and reactive to light.  Neck:     Thyroid: No thyromegaly.     Vascular: No carotid bruit or JVD.     Trachea: No tracheal deviation.  Cardiovascular:     Rate and Rhythm: Normal rate and regular rhythm.     Pulses: Normal pulses.     Heart sounds: Normal heart sounds. No murmur heard.   No friction rub. No gallop.  Pulmonary:     Effort: Pulmonary effort is normal. No respiratory distress.     Breath sounds: Normal breath sounds. No wheezing or rales.  Chest:     Chest wall: No tenderness.  Abdominal:     General: Bowel sounds are normal.     Palpations: Abdomen is soft.     Tenderness: There is no abdominal tenderness.  Musculoskeletal:        General: Normal range of motion.     Cervical back: Normal range of motion and neck supple.     Right lower leg: Edema present.     Left lower leg: Edema present.  Lymphadenopathy:     Cervical: No cervical adenopathy.  Skin:    General: Skin is warm and dry.  Neurological:     General: No focal deficit present.     Mental Status: He is alert and oriented to person, place, and time.     Cranial Nerves: No cranial nerve deficit.  Psychiatric:        Mood and Affect: Mood normal.         Behavior: Behavior normal.        Thought Content: Thought content normal.        Judgment: Judgment normal.     LABS: Recent Results (from the past 2160 hour(s))  CBC w/Diff/Platelet     Status: Abnormal   Collection Time: 12/09/21  8:38 AM  Result Value Ref Range   WBC 8.0 3.4 - 10.8 x10E3/uL   RBC 4.33 4.14 - 5.80 x10E6/uL   Hemoglobin 13.8 13.0 - 17.7 g/dL   Hematocrit 41.9 37.5 - 51.0 %   MCV 97 79 -  97 fL   MCH 31.9 26.6 - 33.0 pg   MCHC 32.9 31.5 - 35.7 g/dL   RDW 13.1 11.6 - 15.4 %   Platelets 251 150 - 450 x10E3/uL   Neutrophils 50 Not Estab. %   Lymphs 33 Not Estab. %   Monocytes 14 Not Estab. %   Eos 2 Not Estab. %   Basos 1 Not Estab. %   Neutrophils Absolute 3.9 1.4 - 7.0 x10E3/uL   Lymphocytes Absolute 2.7 0.7 - 3.1 x10E3/uL   Monocytes Absolute 1.1 (H) 0.1 - 0.9 x10E3/uL   EOS (ABSOLUTE) 0.2 0.0 - 0.4 x10E3/uL   Basophils Absolute 0.1 0.0 - 0.2 x10E3/uL   Immature Granulocytes 0 Not Estab. %   Immature Grans (Abs) 0.0 0.0 - 0.1 x10E3/uL  Comprehensive metabolic panel     Status: Abnormal   Collection Time: 12/09/21  8:38 AM  Result Value Ref Range   Glucose 118 (H) 70 - 99 mg/dL   BUN 18 6 - 24 mg/dL   Creatinine, Ser 0.96 0.76 - 1.27 mg/dL   eGFR 93 >59 mL/min/1.73   BUN/Creatinine Ratio 19 9 - 20   Sodium 141 134 - 144 mmol/L   Potassium 3.7 3.5 - 5.2 mmol/L   Chloride 102 96 - 106 mmol/L   CO2 26 20 - 29 mmol/L   Calcium 9.6 8.7 - 10.2 mg/dL   Total Protein 7.2 6.0 - 8.5 g/dL   Albumin 4.1 3.8 - 4.9 g/dL   Globulin, Total 3.1 1.5 - 4.5 g/dL   Albumin/Globulin Ratio 1.3 1.2 - 2.2   Bilirubin Total 0.4 0.0 - 1.2 mg/dL   Alkaline Phosphatase 143 (H) 44 - 121 IU/L   AST 30 0 - 40 IU/L   ALT 24 0 - 44 IU/L  TSH + free T4     Status: None   Collection Time: 12/09/21  8:38 AM  Result Value Ref Range   TSH 2.700 0.450 - 4.500 uIU/mL   Free T4 1.15 0.82 - 1.77 ng/dL  Lipid Panel With LDL/HDL Ratio     Status: Abnormal   Collection Time:  12/09/21  8:38 AM  Result Value Ref Range   Cholesterol, Total 111 100 - 199 mg/dL   Triglycerides 216 (H) 0 - 149 mg/dL   HDL 31 (L) >39 mg/dL   VLDL Cholesterol Cal 35 5 - 40 mg/dL   LDL Chol Calc (NIH) 45 0 - 99 mg/dL   LDL/HDL Ratio 1.5 0.0 - 3.6 ratio    Comment:                                     LDL/HDL Ratio                                             Men  Women                               1/2 Avg.Risk  1.0    1.5                                   Avg.Risk  3.6    3.2  2X Avg.Risk  6.2    5.0                                3X Avg.Risk  8.0    6.1   PSA Total (Reflex To Free)     Status: None   Collection Time: 12/09/21  8:38 AM  Result Value Ref Range   Prostate Specific Ag, Serum 2.6 0.0 - 4.0 ng/mL    Comment: Roche ECLIA methodology. According to the American Urological Association, Serum PSA should decrease and remain at undetectable levels after radical prostatectomy. The AUA defines biochemical recurrence as an initial PSA value 0.2 ng/mL or greater followed by a subsequent confirmatory PSA value 0.2 ng/mL or greater. Values obtained with different assay methods or kits cannot be used interchangeably. Results cannot be interpreted as absolute evidence of the presence or absence of malignant disease.    Reflex Criteria Comment     Comment: The percent free PSA is performed on a reflex basis only when the total PSA is between 4.0 and 10.0 ng/mL.   POCT HgB A1C     Status: Abnormal   Collection Time: 12/26/21 10:32 AM  Result Value Ref Range   Hemoglobin A1C 6.2 (A) 4.0 - 5.6 %   HbA1c POC (<> result, manual entry)     HbA1c, POC (prediabetic range)     HbA1c, POC (controlled diabetic range)          Assessment/Plan: 1. Encounter for general adult medical examination with abnormal findings CPE performed, routine fasting labs reviewed, overdue for colonoscopy but would like to hold off until summer when he can coordinate a  ride  2. Essential hypertension Stable, continue current medications  3. Impaired fasting blood sugar - POCT HgB A1C is 6.2 which places him in prediabetic range.  He is given samples of Rybelsus as well as a prescription sent to pharmacy to help with blood sugar control as well as weight loss benefits.  Patient educated on diet and exercise - Semaglutide (RYBELSUS) 3 MG TABS; Take 3 mg by mouth daily.  Dispense: 30 tablet; Refill: 2  4. Congestive heart failure with left ventricular diastolic dysfunction, chronic ([place]) Previously followed by cardiology however has not had a follow-up in a long time.  Patient was advised to follow up especially given worsening lower extremity edema  5. Lower extremity edema Continues to take Lasix and is working on weight loss.  Will have follow-up with cardiology.  Previously did lymphedema clinic however declines this at time.  Continue to wear compression stockings  6. Mixed hyperlipidemia Continue Crestor  7. Morbid obesity with BMI of 50.0-59.9, adult (HCC) Diet and exercise and will start on Rybelsus to aid with blood sugars as well as weight loss goals  8. Dysuria - UA/M w/rflx Culture, Routine   General Counseling: keanan melander understanding of the findings of todays visit and agrees with plan of treatment. I have discussed any further diagnostic evaluation that may be needed or ordered today. We also reviewed his medications today. he has been encouraged to call the office with any questions or concerns that should arise related to todays visit.    Counseling:    Orders Placed This Encounter  Procedures   UA/M w/rflx Culture, Routine   POCT HgB A1C    Meds ordered this encounter  Medications   Semaglutide (RYBELSUS) 3 MG TABS    Sig: Take 3 mg by mouth  daily.    Dispense:  30 tablet    Refill:  2    This patient was seen by Drema Dallas, PA-C in collaboration with Dr. Clayborn Bigness as a part of collaborative care  agreement.  Total time spent:35 Minutes  Time spent includes review of chart, medications, test results, and follow up plan with the patient.     Lavera Guise, MD  Internal Medicine

## 2021-12-27 LAB — MICROSCOPIC EXAMINATION
Bacteria, UA: NONE SEEN
Casts: NONE SEEN /lpf
RBC, Urine: NONE SEEN /hpf (ref 0–2)

## 2021-12-27 LAB — UA/M W/RFLX CULTURE, ROUTINE
Bilirubin, UA: NEGATIVE
Glucose, UA: NEGATIVE
Ketones, UA: NEGATIVE
Leukocytes,UA: NEGATIVE
Nitrite, UA: NEGATIVE
Protein,UA: NEGATIVE
RBC, UA: NEGATIVE
Specific Gravity, UA: 1.026 (ref 1.005–1.030)
Urobilinogen, Ur: 0.2 mg/dL (ref 0.2–1.0)
pH, UA: 5.5 (ref 5.0–7.5)

## 2022-01-01 ENCOUNTER — Telehealth: Payer: Self-pay

## 2022-01-01 NOTE — Telephone Encounter (Signed)
PA sent 01/01/22 for RYBELSUS 3 mg at 753pm ?

## 2022-01-30 ENCOUNTER — Ambulatory Visit: Payer: 59 | Admitting: Physician Assistant

## 2022-02-27 ENCOUNTER — Ambulatory Visit (INDEPENDENT_AMBULATORY_CARE_PROVIDER_SITE_OTHER): Payer: 59 | Admitting: Physician Assistant

## 2022-02-27 ENCOUNTER — Encounter: Payer: Self-pay | Admitting: Physician Assistant

## 2022-02-27 DIAGNOSIS — I10 Essential (primary) hypertension: Secondary | ICD-10-CM | POA: Diagnosis not present

## 2022-02-27 DIAGNOSIS — E782 Mixed hyperlipidemia: Secondary | ICD-10-CM | POA: Diagnosis not present

## 2022-02-27 DIAGNOSIS — I5032 Chronic diastolic (congestive) heart failure: Secondary | ICD-10-CM | POA: Diagnosis not present

## 2022-02-27 DIAGNOSIS — Z6841 Body Mass Index (BMI) 40.0 and over, adult: Secondary | ICD-10-CM

## 2022-02-27 DIAGNOSIS — R7303 Prediabetes: Secondary | ICD-10-CM | POA: Diagnosis not present

## 2022-02-27 MED ORDER — METFORMIN HCL 500 MG PO TABS: 500.0000 mg | ORAL_TABLET | Freq: Every day | ORAL | 2 refills | Status: DC

## 2022-02-27 MED ORDER — CARVEDILOL 6.25 MG PO TABS: ORAL_TABLET | ORAL | 1 refills | Status: DC

## 2022-02-27 MED ORDER — FUROSEMIDE 80 MG PO TABS: ORAL_TABLET | ORAL | 1 refills | Status: DC

## 2022-02-27 MED ORDER — ROSUVASTATIN CALCIUM 10 MG PO TABS: 10.0000 mg | ORAL_TABLET | Freq: Every day | ORAL | 1 refills | Status: DC

## 2022-02-27 NOTE — Progress Notes (Signed)
[facility] [address] [place], [place] [address]  Internal MEDICINE  Office Visit Note  Patient Name: Bradley Velez  [id number]  [id number]  Date of Service: 02/28/2022  Chief Complaint  Patient presents with   Follow-up   Hyperlipidemia   Hypertension    HPI Pt is here for routine follow up and is doing well -Started rybelsus and is tolerating well, but sample ran out and has not heard if PA approved or not. He is down 6lbs since last visit. Discussed starting metformin as alternative while resubmitting PA for rybelsus -BP stable -colonoscopy will be ordered next visit as he does not have anyone to help with transportation after currently and is wondering if something closer to home -Will follow up with Dr. Gwen Pounds for cardiology follow up in June   Current Medication: Outpatient Encounter Medications as of 02/27/2022  Medication Sig   albuterol (VENTOLIN HFA) 108 (90 Base) MCG/ACT inhaler Inhale 2 puffs into the lungs every 6 (six) hours as needed for wheezing or shortness of breath.   aspirin EC 81 MG EC tablet Take 1 tablet (81 mg total) by mouth daily.   cephALEXin (KEFLEX) 500 MG capsule Take 1 capsule (500 mg total) by mouth 3 (three) times daily.   ipratropium-albuterol (DUONEB) 0.5-2.5 (3) MG/3ML SOLN Take 3 mLs by nebulization every 4 (four) hours as needed.   metFORMIN (GLUCOPHAGE) 500 MG tablet Take 1 tablet (500 mg total) by mouth daily with breakfast.   Semaglutide (RYBELSUS) 3 MG TABS Take 3 mg by mouth daily.   [DISCONTINUED] carvedilol (COREG) 6.25 MG tablet Take 1 tablet by mouth twice daily   [DISCONTINUED] furosemide (LASIX) 80 MG tablet TAKE 1 TABLET BY MOUTH ONCE DAILY   [DISCONTINUED] rosuvastatin (CRESTOR) 10 MG tablet Take 1 tablet (10 mg total) by mouth daily.   carvedilol (COREG) 6.25 MG tablet Take 1 tablet by mouth twice daily   furosemide (LASIX) 80 MG tablet TAKE 1 TABLET BY MOUTH ONCE DAILY   rosuvastatin (CRESTOR) 10 MG tablet Take 1  tablet (10 mg total) by mouth daily.   No facility-administered encounter medications on file as of 02/27/2022.    Surgical History: Past Surgical History:  Procedure Laterality Date   EYE SURGERY Bilateral 1970    Medical History: Past Medical History:  Diagnosis Date   Hyperlipidemia    Hypertension    Obesity    Sleep apnea     Family History: Family History  Problem Relation Age of Onset   Emphysema Father    Lung cancer Father     Social History   Socioeconomic History   Marital status: Divorced    Spouse name: Not on file   Number of children: Not on file   Years of education: Not on file   Highest education level: Not on file  Occupational History   Not on file  Tobacco Use   Smoking status: Never   Smokeless tobacco: Never  Substance and Sexual Activity   Alcohol use: Yes    Comment: social   Drug use: No   Sexual activity: Not on file  Other Topics Concern   Not on file  Social History Narrative   Not on file   Social Determinants of Health   Financial Resource Strain: Not on file  Food Insecurity: Not on file  Transportation Needs: Not on file  Physical Activity: Not on file  Stress: Not on file  Social Connections: Not on file  Intimate Partner Violence: Not on file  Review of Systems  Constitutional:  Negative for activity change, chills, fatigue and unexpected weight change.  HENT:  Negative for congestion, postnasal drip, rhinorrhea, sneezing and sore throat.   Respiratory:  Negative for cough, chest tightness, shortness of breath and wheezing.   Cardiovascular:  Positive for leg swelling. Negative for chest pain and palpitations.  Gastrointestinal:  Negative for abdominal pain, constipation, diarrhea, nausea and vomiting.  Endocrine: Negative for cold intolerance, heat intolerance, polydipsia and polyuria.  Genitourinary:  Negative for dysuria, frequency and urgency.  Musculoskeletal:  Negative for arthralgias, back pain, joint  swelling and neck pain.  Skin:  Negative for rash.  Allergic/Immunologic: Negative for environmental allergies.  Neurological:  Negative for dizziness, tremors, numbness and headaches.  Hematological:  Negative for adenopathy. Does not bruise/bleed easily.  Psychiatric/Behavioral:  Negative for behavioral problems (Depression), sleep disturbance and suicidal ideas. The patient is not nervous/anxious.    Vital Signs: BP 134/84   Pulse 68   Temp 97.8 F (36.6 C)   Resp 16   Ht  (1.676 m)   Wt (!) 372 lb (168.7 kg)   SpO2 94%   BMI 60.04 kg/m    Physical Exam Vitals and nursing note reviewed.  Constitutional:      General: He is not in acute distress.    Appearance: Normal appearance. He is well-developed. He is obese. He is not diaphoretic.  HENT:     Head: Normocephalic and atraumatic.     Nose: Nose normal.     Mouth/Throat:     Pharynx: No oropharyngeal exudate.  Eyes:     Pupils: Pupils are equal, round, and reactive to light.  Neck:     Thyroid: No thyromegaly.     Vascular: No carotid bruit or JVD.     Trachea: No tracheal deviation.  Cardiovascular:     Rate and Rhythm: Normal rate and regular rhythm.     Pulses: Normal pulses.     Heart sounds: Normal heart sounds. No murmur heard.   No friction rub. No gallop.  Pulmonary:     Effort: Pulmonary effort is normal. No respiratory distress.     Breath sounds: Normal breath sounds. No wheezing or rales.  Chest:     Chest wall: No tenderness.  Abdominal:     General: Bowel sounds are normal.     Palpations: Abdomen is soft.     Tenderness: There is no abdominal tenderness.  Musculoskeletal:        General: Normal range of motion.     Cervical back: Normal range of motion and neck supple.     Right lower leg: Edema present.     Left lower leg: Edema present.  Lymphadenopathy:     Cervical: No cervical adenopathy.  Skin:    General: Skin is warm and dry.  Neurological:     General: No focal deficit  present.     Mental Status: He is alert and oriented to person, place, and time.     Cranial Nerves: No cranial nerve deficit.  Psychiatric:        Mood and Affect: Mood normal.        Behavior: Behavior normal.        Thought Content: Thought content normal.        Judgment: Judgment normal.       Assessment/Plan: 1. Essential hypertension Continue current medications - carvedilol (COREG) 6.25 MG tablet; Take 1 tablet by mouth twice daily  Dispense: 180 tablet; Refill: 1  2. Prediabetes Will start metformin while working on PA for rybelsus to help BG and wt loss - metFORMIN (GLUCOPHAGE) 500 MG tablet; Take 1 tablet (500 mg total) by mouth daily with breakfast.  Dispense: 30 tablet; Refill: 2  3. Congestive heart failure with left ventricular diastolic dysfunction, chronic (HCC) May continue lasix as before and wear compression stockings. Will have follow up with cardiology - furosemide (LASIX) 80 MG tablet; TAKE 1 TABLET BY MOUTH ONCE DAILY  Dispense: 90 tablet; Refill: 1  4. Mixed hyperlipidemia Continue crestor - rosuvastatin (CRESTOR) 10 MG tablet; Take 1 tablet (10 mg total) by mouth daily.  Dispense: 90 tablet; Refill: 1  5. Morbid obesity with BMI of 60.0-69.9, adult (HCC) Down 6lbs since last visit. Continue to work on diet and exercise. Will work on PA for rybelsus to aid BG and wt loss goals and start on metformin    General Counseling: lily kernen understanding of the findings of todays visit and agrees with plan of treatment. I have discussed any further diagnostic evaluation that may be needed or ordered today. We also reviewed his medications today. he has been encouraged to call the office with any questions or concerns that should arise related to todays visit.    No orders of the defined types were placed in this encounter.   Meds ordered this encounter  Medications   metFORMIN (GLUCOPHAGE) 500 MG tablet    Sig: Take 1 tablet (500 mg total) by mouth  daily with breakfast.    Dispense:  30 tablet    Refill:  2   furosemide (LASIX) 80 MG tablet    Sig: TAKE 1 TABLET BY MOUTH ONCE DAILY    Dispense:  90 tablet    Refill:  1   rosuvastatin (CRESTOR) 10 MG tablet    Sig: Take 1 tablet (10 mg total) by mouth daily.    Dispense:  90 tablet    Refill:  1   carvedilol (COREG) 6.25 MG tablet    Sig: Take 1 tablet by mouth twice daily    Dispense:  180 tablet    Refill:  1    Pt need appt for further refills    This patient was seen by Lynn Ito, PA-C in collaboration with Dr. Beverely Risen as a part of collaborative care agreement.   Total time spent:30 Minutes Time spent includes review of chart, medications, test results, and follow up plan with the patient.      Dr Lyndon Code Internal medicine

## 2022-03-12 ENCOUNTER — Telehealth: Payer: Self-pay

## 2022-03-12 DIAGNOSIS — R7301 Impaired fasting glucose: Secondary | ICD-10-CM

## 2022-03-12 MED ORDER — RYBELSUS 3 MG PO TABS: 3.0000 mg | ORAL_TABLET | Freq: Every day | ORAL | 2 refills | Status: DC

## 2022-03-12 NOTE — Telephone Encounter (Signed)
PA for RYBELSUS 3 mg Approved 03/11/22 to 03/12/23.  New rx sent to pharmacy

## 2022-03-12 NOTE — Telephone Encounter (Signed)
PA sent for RYBELSUS 3 mg 03/11/22 @ 11:59 pm

## 2022-04-27 IMAGING — US US ABDOMEN LIMITED
1 series · 14 of 25 positions shown · non-contrast
Comparison: None.

CLINICAL DATA: Epigastric/right upper quadrant pain

EXAM:
ULTRASOUND ABDOMEN LIMITED RIGHT UPPER QUADRANT

[Series 1: us abdomen limited ruq · 14 of 89 slices shown]
[im 1/89]
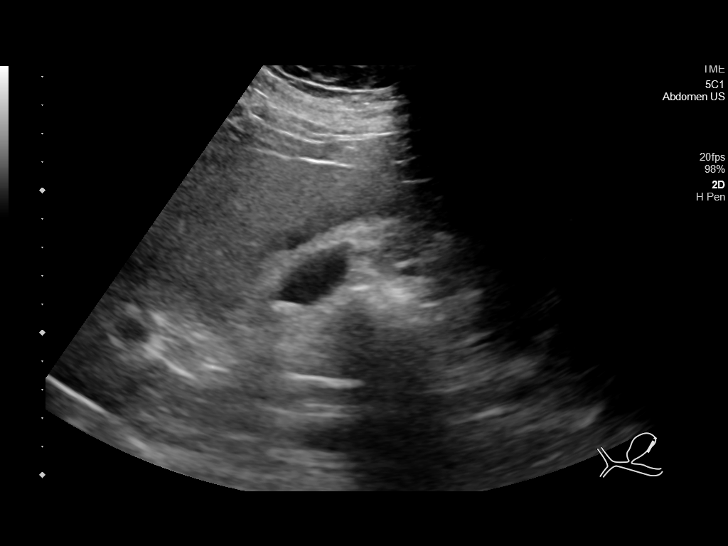
[im 8/89]
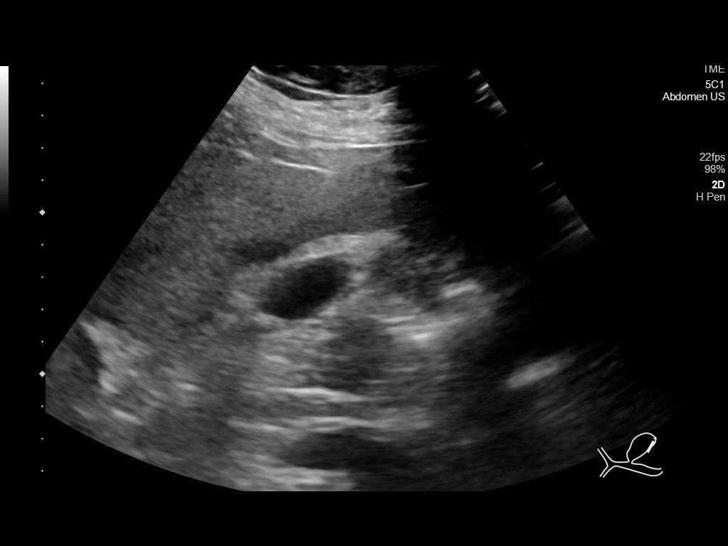
[im 15/89]
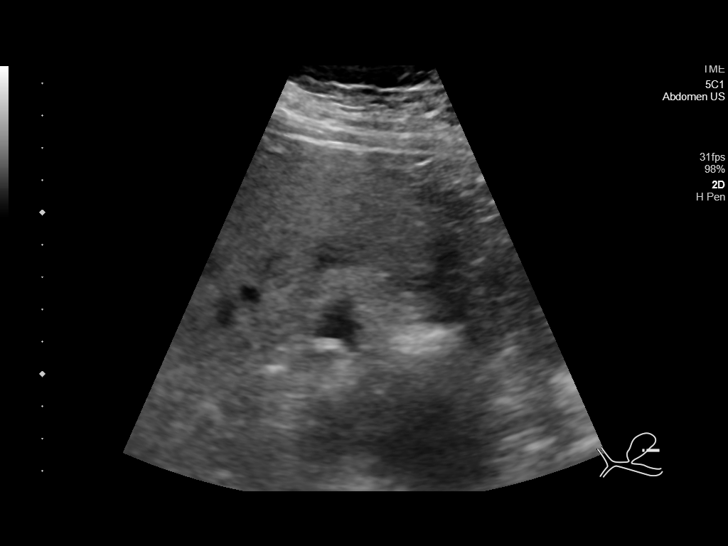
[im 23/89]
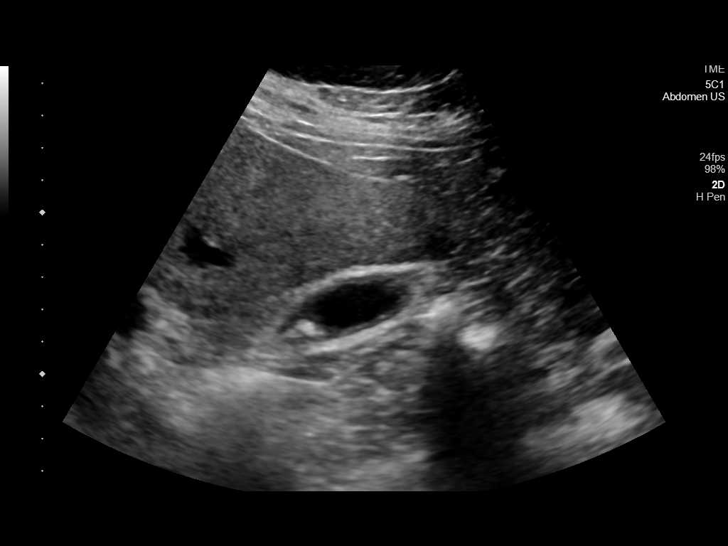
[im 30/89]
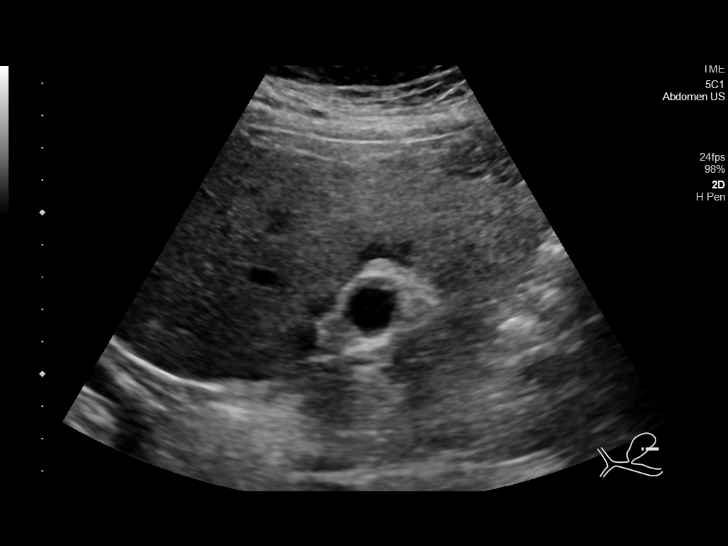
[im 34/89]
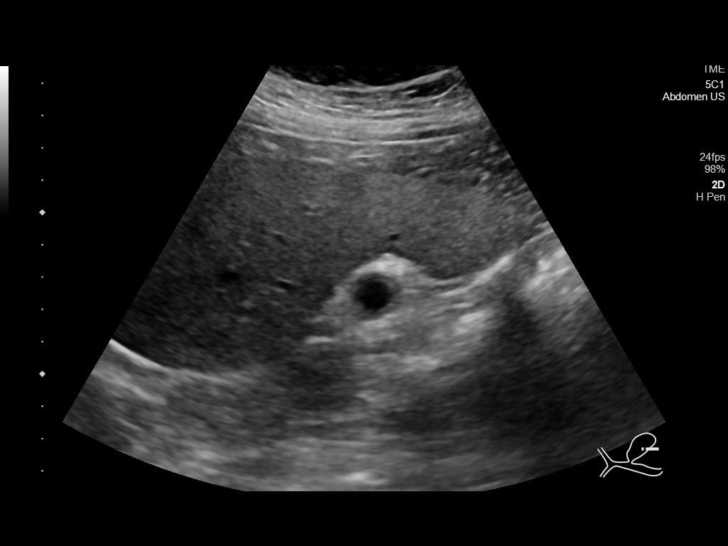
[im 41/89]
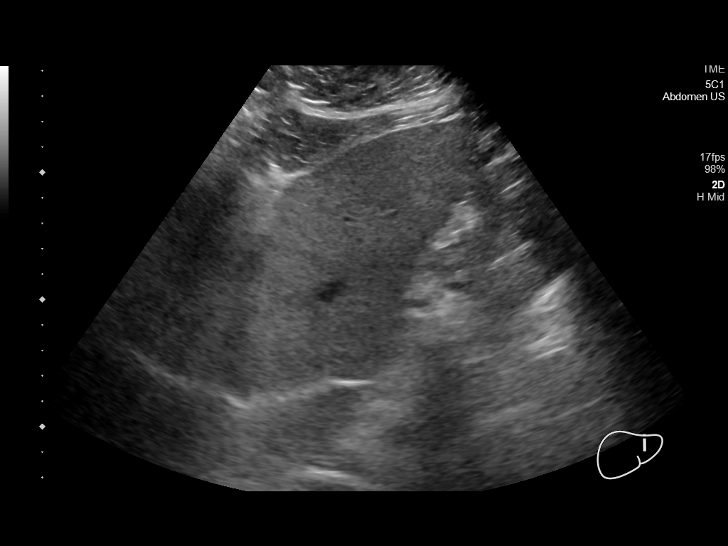
[im 48/89]
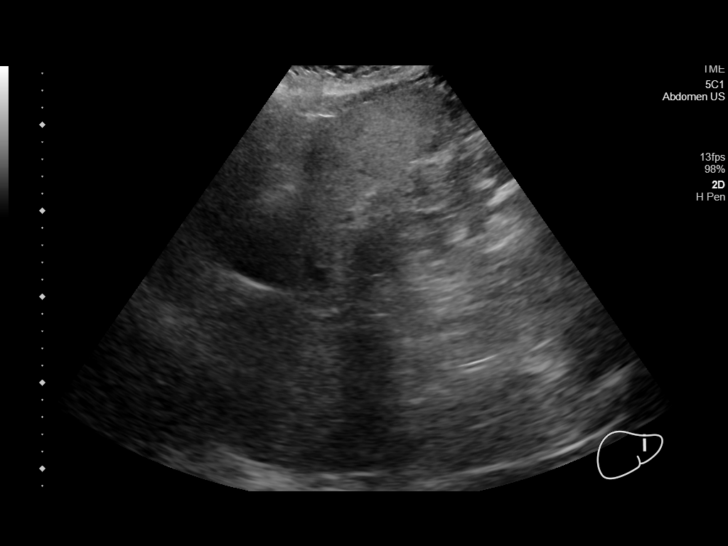
[im 56/89]
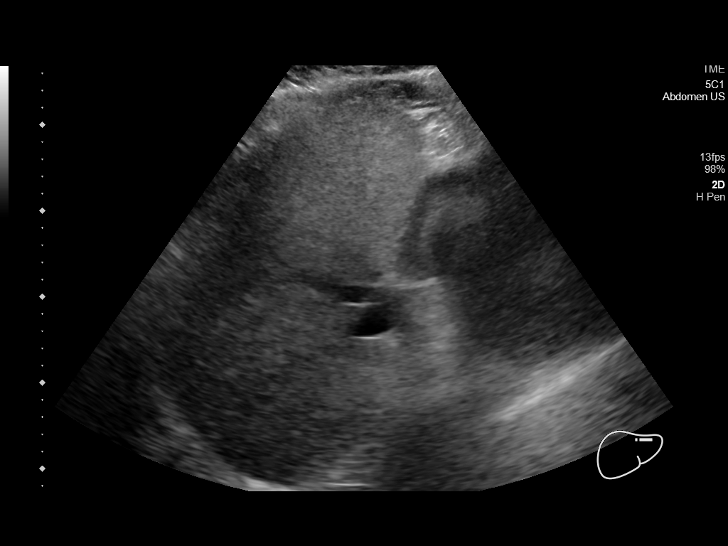
[im 59/89]
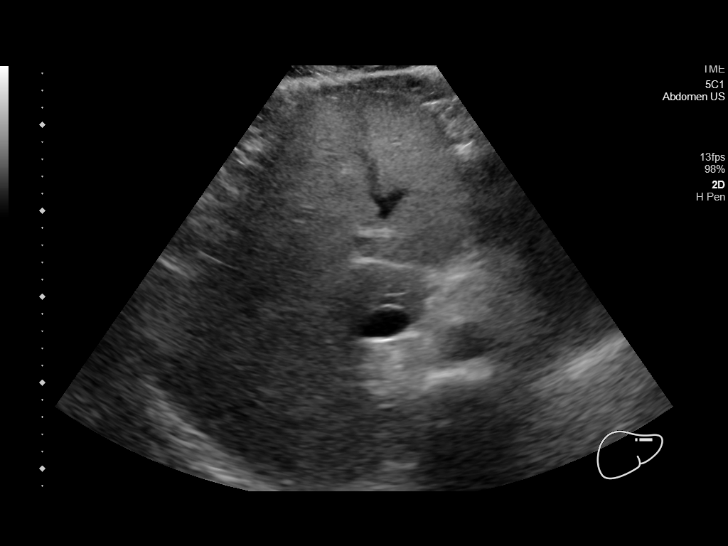
[im 67/89]
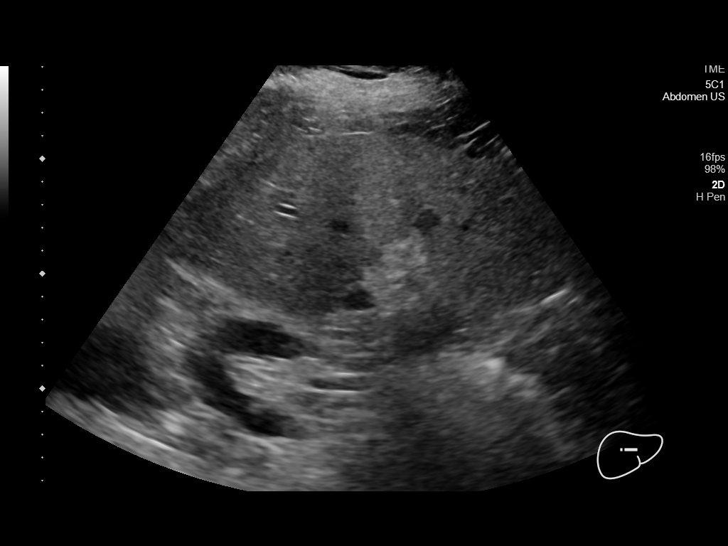
[im 74/89]
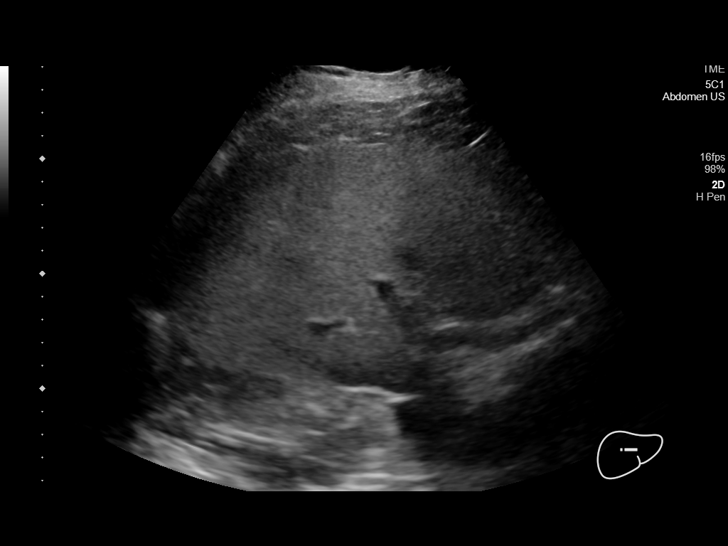
[im 81/89]
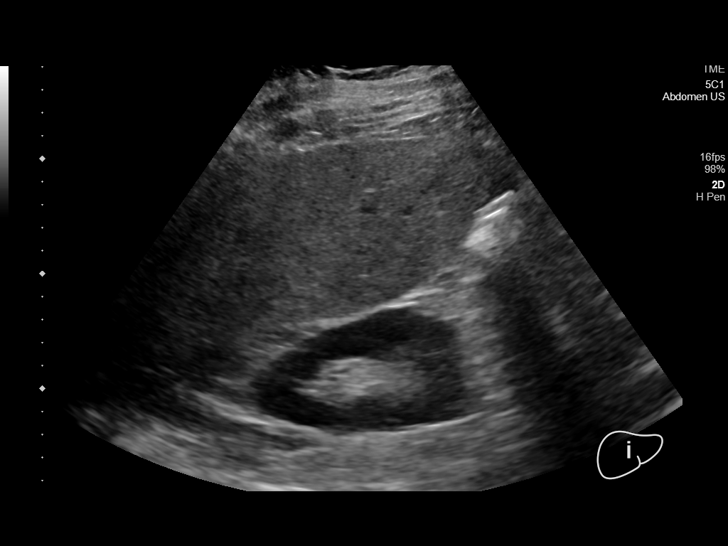
[im 89/89]
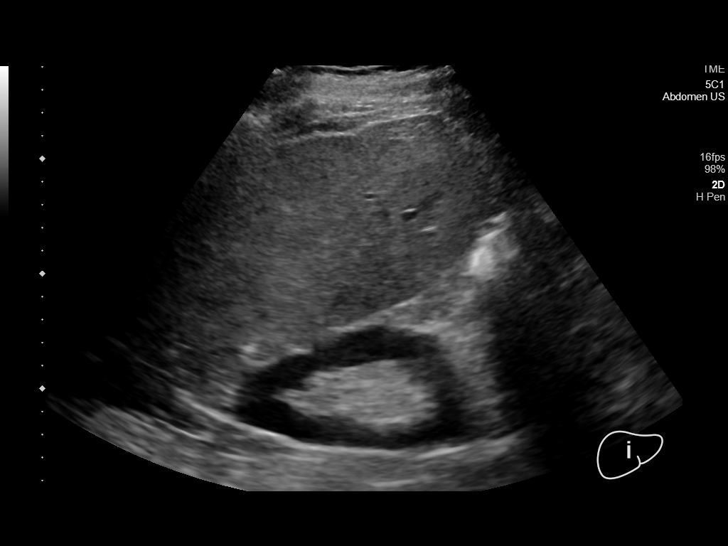

[14 of 25 positions shown; findings below may reference images not displayed]

FINDINGS: Gallbladder:

Shadowing gallstone. Prominent gallbladder wall thickness but under
distended. No pericholecystic edema or reported focal tenderness.

Common bile duct:

Diameter: 4 mm

Liver:

Echogenic liver with diminished acoustic penetration and
pericholecystic sparing. No evidence of focal lesion. Portal vein is
patent on color Doppler imaging with normal direction of blood flow
towards the liver.
IMPRESSION: 1. Cholelithiasis without evidence of cholecystitis.
2. Hepatic steatosis.

## 2022-05-15 ENCOUNTER — Ambulatory Visit (INDEPENDENT_AMBULATORY_CARE_PROVIDER_SITE_OTHER): Payer: 59 | Admitting: Physician Assistant

## 2022-05-15 ENCOUNTER — Encounter: Payer: Self-pay | Admitting: Physician Assistant

## 2022-05-15 DIAGNOSIS — R600 Localized edema: Secondary | ICD-10-CM | POA: Diagnosis not present

## 2022-05-15 DIAGNOSIS — R7301 Impaired fasting glucose: Secondary | ICD-10-CM

## 2022-05-15 DIAGNOSIS — I5032 Chronic diastolic (congestive) heart failure: Secondary | ICD-10-CM | POA: Diagnosis not present

## 2022-05-15 DIAGNOSIS — I10 Essential (primary) hypertension: Secondary | ICD-10-CM

## 2022-05-15 DIAGNOSIS — M25422 Effusion, left elbow: Secondary | ICD-10-CM

## 2022-05-15 DIAGNOSIS — R7303 Prediabetes: Secondary | ICD-10-CM

## 2022-05-15 DIAGNOSIS — Z6841 Body Mass Index (BMI) 40.0 and over, adult: Secondary | ICD-10-CM

## 2022-05-15 LAB — POCT GLYCOSYLATED HEMOGLOBIN (HGB A1C): Hemoglobin A1C: 5.7 % — AB (ref 4.0–5.6)

## 2022-05-15 MED ORDER — METFORMIN HCL 500 MG PO TABS: 500.0000 mg | ORAL_TABLET | Freq: Every day | ORAL | 2 refills | Status: DC

## 2022-05-15 NOTE — Progress Notes (Signed)
[facility] [address] [place], [place] [address]  Internal MEDICINE  Office Visit Note  Patient Name: Bradley Velez  [id number]  [id number]  Date of Service: 05/21/2022  Chief Complaint  Patient presents with   Follow-up   Hyperlipidemia   Hypertension   Elbow Pain    Bruising and feel "soft" - started about a month ago   Quality Metric Gaps    Colonoscopy and Shingles Vaccine    HPI Pt is here for routine follow up -Is scheduling cardiology visit still -Wants to hold off on colonoscopy due to transportation. Advised to call for referral with plenty of notice for when colonoscopy could be scheduled -About a month ago started notices is Left elbow swelling and then goes back down again and fluctuates. A little fluid on elbow but not very painful. A little bruising. No falls or injuries. He does state he rests on this elbow a lot. Discussed considering xray, but he would like to hold off as it doesnt really bother him. Denies fevers or chills and does not feel warm and no erythema present. No swelling of upper extremity outside of elbow. -Tolerating metformin  Current Medication: Outpatient Encounter Medications as of 05/15/2022  Medication Sig   albuterol (VENTOLIN HFA) 108 (90 Base) MCG/ACT inhaler Inhale 2 puffs into the lungs every 6 (six) hours as needed for wheezing or shortness of breath.   aspirin EC 81 MG EC tablet Take 1 tablet (81 mg total) by mouth daily.   carvedilol (COREG) 6.25 MG tablet Take 1 tablet by mouth twice daily   cephALEXin (KEFLEX) 500 MG capsule Take 1 capsule (500 mg total) by mouth 3 (three) times daily.   furosemide (LASIX) 80 MG tablet TAKE 1 TABLET BY MOUTH ONCE DAILY   ipratropium-albuterol (DUONEB) 0.5-2.5 (3) MG/3ML SOLN Take 3 mLs by nebulization every 4 (four) hours as needed.   rosuvastatin (CRESTOR) 10 MG tablet Take 1 tablet (10 mg total) by mouth daily.   Semaglutide (RYBELSUS) 3 MG TABS Take 3 mg by mouth daily.    [DISCONTINUED] metFORMIN (GLUCOPHAGE) 500 MG tablet Take 1 tablet (500 mg total) by mouth daily with breakfast.   metFORMIN (GLUCOPHAGE) 500 MG tablet Take 1 tablet (500 mg total) by mouth daily with breakfast.   No facility-administered encounter medications on file as of 05/15/2022.    Surgical History: Past Surgical History:  Procedure Laterality Date   EYE SURGERY Bilateral 1970    Medical History: Past Medical History:  Diagnosis Date   Hyperlipidemia    Hypertension    Obesity    Sleep apnea     Family History: Family History  Problem Relation Age of Onset   Emphysema Father    Lung cancer Father     Social History   Socioeconomic History   Marital status: Divorced    Spouse name: Not on file   Number of children: Not on file   Years of education: Not on file   Highest education level: Not on file  Occupational History   Not on file  Tobacco Use   Smoking status: Never   Smokeless tobacco: Never  Substance and Sexual Activity   Alcohol use: Yes    Comment: social   Drug use: No   Sexual activity: Not on file  Other Topics Concern   Not on file  Social History Narrative   Not on file   Social Determinants of Health   Financial Resource Strain: Not on file  Food Insecurity: Not  on file  Transportation Needs: Not on file  Physical Activity: Not on file  Stress: Not on file  Social Connections: Not on file  Intimate Partner Violence: Not on file      Review of Systems  Constitutional:  Negative for activity change, chills, fatigue and unexpected weight change.  HENT:  Negative for congestion, postnasal drip, rhinorrhea, sneezing and sore throat.   Respiratory:  Negative for cough, chest tightness, shortness of breath and wheezing.   Cardiovascular:  Positive for leg swelling. Negative for chest pain and palpitations.  Gastrointestinal:  Negative for abdominal pain, constipation, diarrhea, nausea and vomiting.  Endocrine: Negative for cold  intolerance, heat intolerance, polydipsia and polyuria.  Genitourinary:  Negative for dysuria, frequency and urgency.  Musculoskeletal:  Negative for arthralgias, back pain, joint swelling and neck pain.       Left elbow swelling  Skin:  Negative for rash.  Allergic/Immunologic: Negative for environmental allergies.  Neurological:  Negative for dizziness, tremors, numbness and headaches.  Hematological:  Negative for adenopathy. Does not bruise/bleed easily.  Psychiatric/Behavioral:  Negative for behavioral problems (Depression), sleep disturbance and suicidal ideas. The patient is not nervous/anxious.     Vital Signs: BP 123/82   Pulse 77   Temp 97.8 F (36.6 C)   Resp 16   Ht 5\' 6"  (1.676 m)   Wt (!) 373 lb (169.2 kg)   SpO2 94%   BMI 60.20 kg/m    Physical Exam Vitals and nursing note reviewed.  Constitutional:      General: He is not in acute distress.    Appearance: Normal appearance. He is well-developed. He is obese. He is not diaphoretic.  HENT:     Head: Normocephalic and atraumatic.     Nose: Nose normal.     Mouth/Throat:     Pharynx: No oropharyngeal exudate.  Eyes:     Pupils: Pupils are equal, round, and reactive to light.  Neck:     Thyroid: No thyromegaly.     Vascular: No carotid bruit or JVD.     Trachea: No tracheal deviation.  Cardiovascular:     Rate and Rhythm: Normal rate and regular rhythm.     Pulses: Normal pulses.     Heart sounds: Normal heart sounds. No murmur heard.    No friction rub. No gallop.  Pulmonary:     Effort: Pulmonary effort is normal. No respiratory distress.     Breath sounds: Normal breath sounds. No wheezing or rales.  Chest:     Chest wall: No tenderness.  Abdominal:     General: Bowel sounds are normal.     Palpations: Abdomen is soft.     Tenderness: There is no abdominal tenderness.  Musculoskeletal:        General: Swelling present. Normal range of motion.     Cervical back: Normal range of motion and neck  supple.     Right lower leg: Edema present.     Left lower leg: Edema present.     Comments: Mild swelling of left elbow, nontender, no erythema or increased warmth, normal ROM, no obvious deformity  Lymphadenopathy:     Cervical: No cervical adenopathy.  Skin:    General: Skin is warm and dry.  Neurological:     General: No focal deficit present.     Mental Status: He is alert and oriented to person, place, and time.     Cranial Nerves: No cranial nerve deficit.  Psychiatric:  Mood and Affect: Mood normal.        Behavior: Behavior normal.        Thought Content: Thought content normal.        Judgment: Judgment normal.        Assessment/Plan: 1. Essential hypertension Stable, continue current medication  2. Prediabetes - POCT HgB A1C is 5.7 which is improved from 6.2 last check. Continue metformin and working on diet and exercise - metFORMIN (GLUCOPHAGE) 500 MG tablet; Take 1 tablet (500 mg total) by mouth daily with breakfast.  Dispense: 30 tablet; Refill: 2  3. Congestive heart failure with left ventricular diastolic dysfunction, chronic (HCC) Will schedule follow up with cardiology  4. Lower extremity edema Continue lasix as before, will follow up with cardiology  5. Elbow swelling, left Discussed ordering xray for further evaluation but pt declines and will avoid resting on elbow and monitor. Will call if any worsening or new symptoms arise  6. Morbid obesity with BMI of 60.0-69.9, adult ([facility]) Continue metformin and work on diet and exercise   General Counseling: Butch verbalizes understanding of the findings of todays visit and agrees with plan of treatment. I have discussed any further diagnostic evaluation that may be needed or ordered today. We also reviewed his medications today. he has been encouraged to call the office with any questions or concerns that should arise related to todays visit.    Orders Placed This Encounter  Procedures   POCT HgB  A1C    Meds ordered this encounter  Medications   metFORMIN (GLUCOPHAGE) 500 MG tablet    Sig: Take 1 tablet (500 mg total) by mouth daily with breakfast.    Dispense:  30 tablet    Refill:  2    This patient was seen by Drema Dallas, PA-C in collaboration with Dr. Clayborn Bigness as a part of collaborative care agreement.   Total time spent:30 Minutes Time spent includes review of chart, medications, test results, and follow up plan with the patient.      Dr Lavera Guise Internal medicine

## 2022-09-18 ENCOUNTER — Ambulatory Visit: Payer: 59 | Admitting: Physician Assistant

## 2022-09-18 ENCOUNTER — Encounter: Payer: Self-pay | Admitting: Physician Assistant

## 2022-09-18 VITALS — BP 139/82 | HR 76 | Temp 98.3°F | Resp 16 | Ht 66.0 in | Wt 374.6 lb

## 2022-09-18 DIAGNOSIS — R5383 Other fatigue: Secondary | ICD-10-CM

## 2022-09-18 DIAGNOSIS — E782 Mixed hyperlipidemia: Secondary | ICD-10-CM

## 2022-09-18 DIAGNOSIS — I5032 Chronic diastolic (congestive) heart failure: Secondary | ICD-10-CM | POA: Diagnosis not present

## 2022-09-18 DIAGNOSIS — R7303 Prediabetes: Secondary | ICD-10-CM

## 2022-09-18 DIAGNOSIS — I10 Essential (primary) hypertension: Secondary | ICD-10-CM | POA: Diagnosis not present

## 2022-09-18 DIAGNOSIS — Z125 Encounter for screening for malignant neoplasm of prostate: Secondary | ICD-10-CM

## 2022-09-18 LAB — POCT GLYCOSYLATED HEMOGLOBIN (HGB A1C): Hemoglobin A1C: 6.2 % — AB (ref 4.0–5.6)

## 2022-09-18 MED ORDER — METFORMIN HCL 500 MG PO TABS: 500.0000 mg | ORAL_TABLET | Freq: Every day | ORAL | 2 refills | Status: DC

## 2022-09-18 MED ORDER — CARVEDILOL 6.25 MG PO TABS: ORAL_TABLET | ORAL | 1 refills | Status: DC

## 2022-09-18 MED ORDER — ROSUVASTATIN CALCIUM 10 MG PO TABS: 10.0000 mg | ORAL_TABLET | Freq: Every day | ORAL | 1 refills | Status: DC

## 2022-09-18 MED ORDER — FUROSEMIDE 80 MG PO TABS: ORAL_TABLET | ORAL | 1 refills | Status: DC

## 2022-09-18 NOTE — Progress Notes (Signed)
[facility] [address] [place], [place] [address]  Internal MEDICINE  Office Visit Note  Patient Name: Bradley Velez  [id number]  [id number]  Date of Service: 09/26/2022  Chief Complaint  Patient presents with   Follow-up   Hyperlipidemia   Hypertension    HPI Pt is here for routine follow up and has no complaints today -Rejoined [employer] last night and hopes to work on weight loss goals -Wants to wait until new Year for colonoscopy and cardiology follow up visit. -Continues to tolerate metformin, rybelsus previously not covered but may try again for a GLP1 option in the New year to help with BG control and wt loss benefits -Due for labs and will have these done prior to next visit and will include A1c with this  Current Medication: Outpatient Encounter Medications as of 09/18/2022  Medication Sig   albuterol (VENTOLIN HFA) 108 (90 Base) MCG/ACT inhaler Inhale 2 puffs into the lungs every 6 (six) hours as needed for wheezing or shortness of breath.   aspirin EC 81 MG EC tablet Take 1 tablet (81 mg total) by mouth daily.   cephALEXin (KEFLEX) 500 MG capsule Take 1 capsule (500 mg total) by mouth 3 (three) times daily.   ipratropium-albuterol (DUONEB) 0.5-2.5 (3) MG/3ML SOLN Take 3 mLs by nebulization every 4 (four) hours as needed.   [DISCONTINUED] carvedilol (COREG) 6.25 MG tablet Take 1 tablet by mouth twice daily   [DISCONTINUED] furosemide (LASIX) 80 MG tablet TAKE 1 TABLET BY MOUTH ONCE DAILY   [DISCONTINUED] metFORMIN (GLUCOPHAGE) 500 MG tablet Take 1 tablet (500 mg total) by mouth daily with breakfast.   [DISCONTINUED] rosuvastatin (CRESTOR) 10 MG tablet Take 1 tablet (10 mg total) by mouth daily.   [DISCONTINUED] Semaglutide (RYBELSUS) 3 MG TABS Take 3 mg by mouth daily.   carvedilol (COREG) 6.25 MG tablet Take 1 tablet by mouth twice daily   furosemide (LASIX) 80 MG tablet TAKE 1 TABLET BY MOUTH ONCE DAILY   metFORMIN (GLUCOPHAGE) 500 MG tablet Take 1  tablet (500 mg total) by mouth daily with breakfast.   rosuvastatin (CRESTOR) 10 MG tablet Take 1 tablet (10 mg total) by mouth daily.   No facility-administered encounter medications on file as of 09/18/2022.    Surgical History: Past Surgical History:  Procedure Laterality Date   EYE SURGERY Bilateral 1970    Medical History: Past Medical History:  Diagnosis Date   Hyperlipidemia    Hypertension    Obesity    Sleep apnea     Family History: Family History  Problem Relation Age of Onset   Emphysema Father    Lung cancer Father     Social History   Socioeconomic History   Marital status: Divorced    Spouse name: Not on file   Number of children: Not on file   Years of education: Not on file   Highest education level: Not on file  Occupational History   Not on file  Tobacco Use   Smoking status: Never   Smokeless tobacco: Never  Substance and Sexual Activity   Alcohol use: Yes    Comment: social   Drug use: No   Sexual activity: Not on file  Other Topics Concern   Not on file  Social History Narrative   Not on file   Social Determinants of Health   Financial Resource Strain: Not on file  Food Insecurity: Not on file  Transportation Needs: Not on file  Physical Activity: Not on file  Stress: Not on file  Social Connections: Not on file  Intimate Partner Violence: Not on file      Review of Systems  Constitutional:  Negative for activity change, chills, fatigue and unexpected weight change.  HENT:  Negative for congestion, postnasal drip, rhinorrhea, sneezing and sore throat.   Respiratory:  Negative for cough, chest tightness, shortness of breath and wheezing.   Cardiovascular:  Positive for leg swelling. Negative for chest pain and palpitations.  Gastrointestinal:  Negative for abdominal pain, constipation, diarrhea, nausea and vomiting.  Endocrine: Negative for cold intolerance, heat intolerance, polydipsia and polyuria.  Genitourinary:  Negative  for dysuria, frequency and urgency.  Musculoskeletal:  Negative for arthralgias, back pain, joint swelling and neck pain.  Skin:  Negative for rash.  Allergic/Immunologic: Negative for environmental allergies.  Neurological:  Negative for dizziness, tremors, numbness and headaches.  Hematological:  Negative for adenopathy. Does not bruise/bleed easily.  Psychiatric/Behavioral:  Negative for behavioral problems (Depression), sleep disturbance and suicidal ideas. The patient is not nervous/anxious.     Vital Signs: BP 139/82   Pulse 76   Temp 98.3 F (36.8 C)   Resp 16   Ht 5\' 6"  (1.676 m)   Wt (!) 374 lb 9.6 oz (169.9 kg)   SpO2 96%   BMI 60.46 kg/m    Physical Exam Vitals and nursing note reviewed.  Constitutional:      General: He is not in acute distress.    Appearance: Normal appearance. He is well-developed. He is obese. He is not diaphoretic.  HENT:     Head: Normocephalic and atraumatic.     Nose: Nose normal.     Mouth/Throat:     Pharynx: No oropharyngeal exudate.  Eyes:     Pupils: Pupils are equal, round, and reactive to light.  Neck:     Thyroid: No thyromegaly.     Vascular: No carotid bruit or JVD.     Trachea: No tracheal deviation.  Cardiovascular:     Rate and Rhythm: Normal rate and regular rhythm.     Pulses: Normal pulses.     Heart sounds: Normal heart sounds. No murmur heard.    No friction rub. No gallop.  Pulmonary:     Effort: Pulmonary effort is normal. No respiratory distress.     Breath sounds: Normal breath sounds. No wheezing or rales.  Chest:     Chest wall: No tenderness.  Abdominal:     General: Bowel sounds are normal.     Palpations: Abdomen is soft.     Tenderness: There is no abdominal tenderness.  Musculoskeletal:        General: Normal range of motion.     Cervical back: Normal range of motion and neck supple.     Right lower leg: Edema present.     Left lower leg: Edema present.  Lymphadenopathy:     Cervical: No  cervical adenopathy.  Skin:    General: Skin is warm and dry.  Neurological:     General: No focal deficit present.     Mental Status: He is alert and oriented to person, place, and time.     Cranial Nerves: No cranial nerve deficit.  Psychiatric:        Mood and Affect: Mood normal.        Behavior: Behavior normal.        Thought Content: Thought content normal.        Judgment: Judgment normal.  Assessment/Plan: 1. Prediabetes - POCT HgB A1C is 6.2 which is increased from 5.7 last visit. Will continue metformin and work on diet and exercise. Consider retrying GLP1 in New year - metFORMIN (GLUCOPHAGE) 500 MG tablet; Take 1 tablet (500 mg total) by mouth daily with breakfast.  Dispense: 30 tablet; Refill: 2 - Hgb A1C w/o eAG  2. Essential hypertension Stable, continue current medications - carvedilol (COREG) 6.25 MG tablet; Take 1 tablet by mouth twice daily  Dispense: 180 tablet; Refill: 1  3. Congestive heart failure with left ventricular diastolic dysfunction, chronic (HCC) Will schedule cardiology follow up in New year - furosemide (LASIX) 80 MG tablet; TAKE 1 TABLET BY MOUTH ONCE DAILY  Dispense: 90 tablet; Refill: 1  4. Mixed hyperlipidemia Continue crestor and will update labs - rosuvastatin (CRESTOR) 10 MG tablet; Take 1 tablet (10 mg total) by mouth daily.  Dispense: 90 tablet; Refill: 1 - Lipid Panel With LDL/HDL Ratio  5. Screening for prostate cancer - PSA Total (Reflex To Free)  6. Other fatigue - PSA Total (Reflex To Free) - Comprehensive metabolic panel - CBC w/Diff/Platelet - TSH + free T4 - Lipid Panel With LDL/HDL Ratio - Hgb A1C w/o eAG   General Counseling: Bradley Velez verbalizes understanding of the findings of todays visit and agrees with plan of treatment. I have discussed any further diagnostic evaluation that may be needed or ordered today. We also reviewed his medications today. he has been encouraged to call the office with any questions  or concerns that should arise related to todays visit.    Orders Placed This Encounter  Procedures   PSA Total (Reflex To Free)   Comprehensive metabolic panel   CBC w/Diff/Platelet   TSH + free T4   Lipid Panel With LDL/HDL Ratio   Hgb A1C w/o eAG   POCT HgB A1C    Meds ordered this encounter  Medications   carvedilol (COREG) 6.25 MG tablet    Sig: Take 1 tablet by mouth twice daily    Dispense:  180 tablet    Refill:  1   furosemide (LASIX) 80 MG tablet    Sig: TAKE 1 TABLET BY MOUTH ONCE DAILY    Dispense:  90 tablet    Refill:  1   metFORMIN (GLUCOPHAGE) 500 MG tablet    Sig: Take 1 tablet (500 mg total) by mouth daily with breakfast.    Dispense:  30 tablet    Refill:  2   rosuvastatin (CRESTOR) 10 MG tablet    Sig: Take 1 tablet (10 mg total) by mouth daily.    Dispense:  90 tablet    Refill:  1    This patient was seen by Lynn Ito, PA-C in collaboration with Dr. Beverely Risen as a part of collaborative care agreement.   Total time spent:30 Minutes Time spent includes review of chart, medications, test results, and follow up plan with the patient.      Dr Lyndon Code Internal medicine

## 2022-11-06 ENCOUNTER — Other Ambulatory Visit: Payer: Self-pay

## 2022-11-06 DIAGNOSIS — I10 Essential (primary) hypertension: Secondary | ICD-10-CM

## 2022-11-06 DIAGNOSIS — E782 Mixed hyperlipidemia: Secondary | ICD-10-CM

## 2022-11-06 MED ORDER — CARVEDILOL 6.25 MG PO TABS: ORAL_TABLET | ORAL | 1 refills | Status: DC

## 2022-11-06 MED ORDER — ROSUVASTATIN CALCIUM 10 MG PO TABS: 10.0000 mg | ORAL_TABLET | Freq: Every day | ORAL | 1 refills | Status: DC

## 2022-11-08 ENCOUNTER — Other Ambulatory Visit: Payer: Self-pay

## 2022-11-08 DIAGNOSIS — I5032 Chronic diastolic (congestive) heart failure: Secondary | ICD-10-CM

## 2022-11-08 DIAGNOSIS — I10 Essential (primary) hypertension: Secondary | ICD-10-CM

## 2022-11-08 DIAGNOSIS — R7303 Prediabetes: Secondary | ICD-10-CM

## 2022-11-08 DIAGNOSIS — E782 Mixed hyperlipidemia: Secondary | ICD-10-CM

## 2022-11-08 MED ORDER — CARVEDILOL 6.25 MG PO TABS: ORAL_TABLET | ORAL | 1 refills | Status: DC

## 2022-11-08 MED ORDER — ROSUVASTATIN CALCIUM 10 MG PO TABS: 10.0000 mg | ORAL_TABLET | Freq: Every day | ORAL | 1 refills | Status: DC

## 2022-11-08 MED ORDER — FUROSEMIDE 80 MG PO TABS: ORAL_TABLET | ORAL | 1 refills | Status: DC

## 2022-11-08 MED ORDER — METFORMIN HCL 500 MG PO TABS: 500.0000 mg | ORAL_TABLET | Freq: Every day | ORAL | 2 refills | Status: DC

## 2022-12-12 LAB — CBC WITH DIFFERENTIAL/PLATELET
Basophils Absolute: 0.1 10*3/uL (ref 0.0–0.2)
Basos: 1 %
EOS (ABSOLUTE): 0.2 10*3/uL (ref 0.0–0.4)
Eos: 3 %
Hematocrit: 41.2 % (ref 37.5–51.0)
Hemoglobin: 13.9 g/dL (ref 13.0–17.7)
Immature Grans (Abs): 0 10*3/uL (ref 0.0–0.1)
Immature Granulocytes: 0 %
Lymphocytes Absolute: 1.9 10*3/uL (ref 0.7–3.1)
Lymphs: 27 %
MCH: 32.3 pg (ref 26.6–33.0)
MCHC: 33.7 g/dL (ref 31.5–35.7)
MCV: 96 fL (ref 79–97)
Monocytes Absolute: 1 10*3/uL — ABNORMAL HIGH (ref 0.1–0.9)
Monocytes: 14 %
Neutrophils Absolute: 3.8 10*3/uL (ref 1.4–7.0)
Neutrophils: 55 %
Platelets: 246 10*3/uL (ref 150–450)
RBC: 4.31 x10E6/uL (ref 4.14–5.80)
RDW: 12.5 % (ref 11.6–15.4)
WBC: 7 10*3/uL (ref 3.4–10.8)

## 2022-12-12 LAB — COMPREHENSIVE METABOLIC PANEL
ALT: 24 IU/L (ref 0–44)
AST: 24 IU/L (ref 0–40)
Albumin/Globulin Ratio: 1.3 (ref 1.2–2.2)
Albumin: 4 g/dL (ref 3.8–4.9)
Alkaline Phosphatase: 159 IU/L — ABNORMAL HIGH (ref 44–121)
BUN/Creatinine Ratio: 21 — ABNORMAL HIGH (ref 9–20)
BUN: 21 mg/dL (ref 6–24)
Bilirubin Total: 0.4 mg/dL (ref 0.0–1.2)
CO2: 23 mmol/L (ref 20–29)
Calcium: 9.4 mg/dL (ref 8.7–10.2)
Chloride: 105 mmol/L (ref 96–106)
Creatinine, Ser: 1 mg/dL (ref 0.76–1.27)
Globulin, Total: 3.1 g/dL (ref 1.5–4.5)
Glucose: 132 mg/dL — ABNORMAL HIGH (ref 70–99)
Potassium: 4 mmol/L (ref 3.5–5.2)
Sodium: 145 mmol/L — ABNORMAL HIGH (ref 134–144)
Total Protein: 7.1 g/dL (ref 6.0–8.5)
eGFR: 88 mL/min/{1.73_m2} (ref >59)

## 2022-12-12 LAB — PSA TOTAL (REFLEX TO FREE): Prostate Specific Ag, Serum: 3.1 ng/mL (ref 0.0–4.0)

## 2022-12-12 LAB — LIPID PANEL WITH LDL/HDL RATIO
Cholesterol, Total: 110 mg/dL (ref 100–199)
HDL: 30 mg/dL — ABNORMAL LOW (ref >39)
LDL Chol Calc (NIH): 44 mg/dL (ref 0–99)
LDL/HDL Ratio: 1.5 ratio (ref 0.0–3.6)
Triglycerides: 225 mg/dL — ABNORMAL HIGH (ref 0–149)
VLDL Cholesterol Cal: 36 mg/dL (ref 5–40)

## 2022-12-12 LAB — TSH+FREE T4
Free T4: 1.08 ng/dL (ref 0.82–1.77)
TSH: 2.23 u[IU]/mL (ref 0.450–4.500)

## 2022-12-12 LAB — HGB A1C W/O EAG: Hgb A1c MFr Bld: 6.3 % — ABNORMAL HIGH (ref 4.8–5.6)

## 2022-12-18 ENCOUNTER — Encounter: Payer: 59 | Admitting: Physician Assistant

## 2022-12-28 ENCOUNTER — Encounter: Payer: 59 | Admitting: Physician Assistant

## 2023-01-04 ENCOUNTER — Other Ambulatory Visit: Payer: Self-pay | Admitting: Physician Assistant

## 2023-01-04 DIAGNOSIS — R7303 Prediabetes: Secondary | ICD-10-CM

## 2023-01-29 ENCOUNTER — Ambulatory Visit (INDEPENDENT_AMBULATORY_CARE_PROVIDER_SITE_OTHER): Payer: 59 | Admitting: Physician Assistant

## 2023-01-29 ENCOUNTER — Encounter: Payer: Self-pay | Admitting: Physician Assistant

## 2023-01-29 VITALS — BP 132/84 | HR 93 | Temp 98.4°F | Resp 16 | Ht 66.0 in | Wt 360.6 lb

## 2023-01-29 DIAGNOSIS — I10 Essential (primary) hypertension: Secondary | ICD-10-CM

## 2023-01-29 DIAGNOSIS — R300 Dysuria: Secondary | ICD-10-CM

## 2023-01-29 DIAGNOSIS — G4733 Obstructive sleep apnea (adult) (pediatric): Secondary | ICD-10-CM

## 2023-01-29 DIAGNOSIS — I5032 Chronic diastolic (congestive) heart failure: Secondary | ICD-10-CM

## 2023-01-29 DIAGNOSIS — E782 Mixed hyperlipidemia: Secondary | ICD-10-CM

## 2023-01-29 DIAGNOSIS — R7303 Prediabetes: Secondary | ICD-10-CM | POA: Diagnosis not present

## 2023-01-29 DIAGNOSIS — Z0001 Encounter for general adult medical examination with abnormal findings: Secondary | ICD-10-CM | POA: Diagnosis not present

## 2023-01-29 DIAGNOSIS — Z6841 Body Mass Index (BMI) 40.0 and over, adult: Secondary | ICD-10-CM

## 2023-01-29 NOTE — Progress Notes (Signed)
[facility] [address] [place], [place] [address]  Internal MEDICINE  Office Visit Note  Patient Name: Bradley Velez  [id number]  [id number]  Date of Service: 02/07/2023  Chief Complaint  Patient presents with   Annual Exam    Review labs   Hypertension   Hyperlipidemia     HPI Pt is here for routine health maintenance examination -Labs reviewed-Tg still elevated, A1c up slightly -Did contact cardiology office and had appt, but he had the flu and rescheduled for May. Dr. Gwen Pounds retired so he will be seeing another provider at the practice -Still elevating legs when sitting, not wearing stockings much anymore and has felt better. Continues to take his fluid pill and is manageable -Did have the flu a few weeks ago and is feeling better now. Did lose ~20lbs while sick and is trying to keep it off. -Wearing cpap nightly  Current Medication: Outpatient Encounter Medications as of 01/29/2023  Medication Sig   albuterol (VENTOLIN HFA) 108 (90 Base) MCG/ACT inhaler Inhale 2 puffs into the lungs every 6 (six) hours as needed for wheezing or shortness of breath.   carvedilol (COREG) 6.25 MG tablet Take 1 tablet by mouth twice daily   cephALEXin (KEFLEX) 500 MG capsule Take 1 capsule (500 mg total) by mouth 3 (three) times daily.   furosemide (LASIX) 80 MG tablet TAKE 1 TABLET BY MOUTH ONCE DAILY   metFORMIN (GLUCOPHAGE) 500 MG tablet TAKE 1 TABLET BY MOUTH DAILY  WITH BREAKFAST   rosuvastatin (CRESTOR) 10 MG tablet Take 1 tablet (10 mg total) by mouth daily.   aspirin EC 81 MG EC tablet Take 1 tablet (81 mg total) by mouth daily.   ipratropium-albuterol (DUONEB) 0.5-2.5 (3) MG/3ML SOLN Take 3 mLs by nebulization every 4 (four) hours as needed.   No facility-administered encounter medications on file as of 01/29/2023.    Surgical History: Past Surgical History:  Procedure Laterality Date   EYE SURGERY Bilateral 1970    Medical History: Past Medical History:   Diagnosis Date   Hyperlipidemia    Hypertension    Obesity    Sleep apnea     Family History: Family History  Problem Relation Age of Onset   Emphysema Father    Lung cancer Father       Review of Systems  Constitutional:  Negative for activity change, chills, fatigue and unexpected weight change.  HENT:  Negative for congestion, postnasal drip, rhinorrhea, sneezing and sore throat.   Respiratory:  Negative for cough, chest tightness, shortness of breath and wheezing.   Cardiovascular:  Positive for leg swelling. Negative for chest pain and palpitations.  Gastrointestinal:  Negative for abdominal pain, constipation, diarrhea, nausea and vomiting.  Endocrine: Negative for cold intolerance, heat intolerance, polydipsia and polyuria.  Genitourinary:  Negative for dysuria, frequency and urgency.  Musculoskeletal:  Negative for arthralgias, back pain, joint swelling and neck pain.  Skin:  Negative for rash.  Allergic/Immunologic: Negative for environmental allergies.  Neurological:  Negative for dizziness, tremors, numbness and headaches.  Hematological:  Negative for adenopathy. Does not bruise/bleed easily.  Psychiatric/Behavioral:  Negative for behavioral problems (Depression), sleep disturbance and suicidal ideas. The patient is not nervous/anxious.      Vital Signs: BP 132/84   Pulse 93   Temp 98.4 F (36.9 C)   Resp 16   Ht 5\' 6"  (1.676 m)   Wt (!) 360 lb 9.6 oz (163.6 kg)   SpO2 97%   BMI 58.20 kg/m  Physical Exam Vitals and nursing note reviewed.  Constitutional:      General: He is not in acute distress.    Appearance: Normal appearance. He is well-developed. He is obese. He is not diaphoretic.  HENT:     Head: Normocephalic and atraumatic.     Nose: Nose normal.     Mouth/Throat:     Pharynx: No oropharyngeal exudate.  Eyes:     Pupils: Pupils are equal, round, and reactive to light.  Neck:     Thyroid: No thyromegaly.     Vascular: No carotid  bruit or JVD.     Trachea: No tracheal deviation.  Cardiovascular:     Rate and Rhythm: Normal rate and regular rhythm.     Pulses: Normal pulses.     Heart sounds: Normal heart sounds. No murmur heard.    No friction rub. No gallop.  Pulmonary:     Effort: Pulmonary effort is normal. No respiratory distress.     Breath sounds: Normal breath sounds. No wheezing or rales.  Chest:     Chest wall: No tenderness.  Abdominal:     General: Bowel sounds are normal.     Palpations: Abdomen is soft.     Tenderness: There is no abdominal tenderness.  Musculoskeletal:        General: Normal range of motion.     Cervical back: Normal range of motion and neck supple.     Right lower leg: Edema present.     Left lower leg: Edema present.  Lymphadenopathy:     Cervical: No cervical adenopathy.  Skin:    General: Skin is warm and dry.  Neurological:     General: No focal deficit present.     Mental Status: He is alert and oriented to person, place, and time.     Cranial Nerves: No cranial nerve deficit.  Psychiatric:        Mood and Affect: Mood normal.        Behavior: Behavior normal.        Thought Content: Thought content normal.        Judgment: Judgment normal.      LABS: Recent Results (from the past 2160 hour(s))  PSA Total (Reflex To Free)     Status: None   Collection Time: 12/11/22  9:13 AM  Result Value Ref Range   Prostate Specific Ag, Serum 3.1 0.0 - 4.0 ng/mL    Comment: Roche ECLIA methodology. According to the American Urological Association, Serum PSA should decrease and remain at undetectable levels after radical prostatectomy. The AUA defines biochemical recurrence as an initial PSA value 0.2 ng/mL or greater followed by a subsequent confirmatory PSA value 0.2 ng/mL or greater. Values obtained with different assay methods or kits cannot be used interchangeably. Results cannot be interpreted as absolute evidence of the presence or absence of malignant disease.     Reflex Criteria Comment     Comment: The percent free PSA is performed on a reflex basis only when the total PSA is between 4.0 and 10.0 ng/mL.   Comprehensive metabolic panel     Status: Abnormal   Collection Time: 12/11/22  9:13 AM  Result Value Ref Range   Glucose 132 (H) 70 - 99 mg/dL   BUN 21 6 - 24 mg/dL   Creatinine, Ser 4.09 0.76 - 1.27 mg/dL   eGFR 88 >81 XB/JYN/8.29   BUN/Creatinine Ratio 21 (H) 9 - 20   Sodium 145 (H) 134 - 144 mmol/L  Potassium 4.0 3.5 - 5.2 mmol/L   Chloride 105 96 - 106 mmol/L   CO2 23 20 - 29 mmol/L   Calcium 9.4 8.7 - 10.2 mg/dL   Total Protein 7.1 6.0 - 8.5 g/dL   Albumin 4.0 3.8 - 4.9 g/dL   Globulin, Total 3.1 1.5 - 4.5 g/dL   Albumin/Globulin Ratio 1.3 1.2 - 2.2   Bilirubin Total 0.4 0.0 - 1.2 mg/dL   Alkaline Phosphatase 159 (H) 44 - 121 IU/L   AST 24 0 - 40 IU/L   ALT 24 0 - 44 IU/L  CBC w/Diff/Platelet     Status: Abnormal   Collection Time: 12/11/22  9:13 AM  Result Value Ref Range   WBC 7.0 3.4 - 10.8 x10E3/uL   RBC 4.31 4.14 - 5.80 x10E6/uL   Hemoglobin 13.9 13.0 - 17.7 g/dL   Hematocrit 16.1 09.6 - 51.0 %   MCV 96 79 - 97 fL   MCH 32.3 26.6 - 33.0 pg   MCHC 33.7 31.5 - 35.7 g/dL   RDW 04.5 40.9 - 81.1 %   Platelets 246 150 - 450 x10E3/uL   Neutrophils 55 Not Estab. %   Lymphs 27 Not Estab. %   Monocytes 14 Not Estab. %   Eos 3 Not Estab. %   Basos 1 Not Estab. %   Neutrophils Absolute 3.8 1.4 - 7.0 x10E3/uL   Lymphocytes Absolute 1.9 0.7 - 3.1 x10E3/uL   Monocytes Absolute 1.0 (H) 0.1 - 0.9 x10E3/uL   EOS (ABSOLUTE) 0.2 0.0 - 0.4 x10E3/uL   Basophils Absolute 0.1 0.0 - 0.2 x10E3/uL   Immature Granulocytes 0 Not Estab. %   Immature Grans (Abs) 0.0 0.0 - 0.1 x10E3/uL  TSH + free T4     Status: None   Collection Time: 12/11/22  9:13 AM  Result Value Ref Range   TSH 2.230 0.450 - 4.500 uIU/mL   Free T4 1.08 0.82 - 1.77 ng/dL  Lipid Panel With LDL/HDL Ratio     Status: Abnormal   Collection Time: 12/11/22  9:13 AM   Result Value Ref Range   Cholesterol, Total 110 100 - 199 mg/dL   Triglycerides 914 (H) 0 - 149 mg/dL   HDL 30 (L) >78 mg/dL   VLDL Cholesterol Cal 36 5 - 40 mg/dL   LDL Chol Calc (NIH) 44 0 - 99 mg/dL   LDL/HDL Ratio 1.5 0.0 - 3.6 ratio    Comment:                                     LDL/HDL Ratio                                             Men  Women                               1/2 Avg.Risk  1.0    1.5                                   Avg.Risk  3.6    3.2  2X Avg.Risk  6.2    5.0                                3X Avg.Risk  8.0    6.1   Hgb A1C w/o eAG     Status: Abnormal   Collection Time: 12/11/22  9:13 AM  Result Value Ref Range   Hgb A1c MFr Bld 6.3 (H) 4.8 - 5.6 %    Comment:          Prediabetes: 5.7 - 6.4          Diabetes: >6.4          Glycemic control for adults with diabetes: <7.0   UA/M w/rflx Culture, Routine     Status: Abnormal   Collection Time: 01/29/23  3:53 PM   Specimen: Urine   Urine  Result Value Ref Range   Specific Gravity, UA 1.021 1.005 - 1.030   pH, UA 5.5 5.0 - 7.5   Color, UA Yellow Yellow   Appearance Ur Turbid (A) Clear   Leukocytes,UA Negative Negative   Protein,UA Negative Negative/Trace   Glucose, UA Negative Negative   Ketones, UA Negative Negative   RBC, UA Negative Negative   Bilirubin, UA Negative Negative   Urobilinogen, Ur 0.2 0.2 - 1.0 mg/dL   Nitrite, UA Negative Negative   Microscopic Examination Comment     Comment: Microscopic follows if indicated.   Microscopic Examination See below:     Comment: Microscopic was indicated and was performed.   Urinalysis Reflex Comment     Comment: This specimen will not reflex to a Urine Culture.  Microscopic Examination     Status: None   Collection Time: 01/29/23  3:53 PM   Urine  Result Value Ref Range   WBC, UA None seen 0 - 5 /hpf   RBC, Urine None seen 0 - 2 /hpf   Epithelial Cells (non renal) None seen 0 - 10 /hpf   Casts None seen None seen  /lpf   Bacteria, UA None seen None seen/Few        Assessment/Plan: 1. Encounter for general adult medical examination with abnormal findings CPE performed, labs reviewed, pt still needs to schedule colonoscopy  2. Essential hypertension Controlled, continue current medication  3. Congestive heart failure with left ventricular diastolic dysfunction, chronic Has upcoming appt with cardiology  4. Prediabetes A1c slightly increased to 6.3 from 6.2, tolerating metformin and will continue to work on diet and exercise  5. OSA on CPAP Continue cpap nightly  6. Mixed hyperlipidemia Continue crestor  7. Morbid obesity with BMI of 60.0-69.9, adult Continue to work on diet and exercise  8. Dysuria - UA/M w/rflx Culture, Routine   General Counseling: Thornton Papasnthony verbalizes understanding of the findings of todays visit and agrees with plan of treatment. I have discussed any further diagnostic evaluation that may be needed or ordered today. We also reviewed his medications today. he has been encouraged to call the office with any questions or concerns that should arise related to todays visit.    Counseling:    Orders Placed This Encounter  Procedures   Microscopic Examination   UA/M w/rflx Culture, Routine    No orders of the defined types were placed in this encounter.   This patient was seen by Lynn ItoLauren Kyra Laffey, PA-C in collaboration with Dr. Beverely RisenFozia Khan as a part of collaborative care agreement.  Total time spent:35 Minutes  Time  spent includes review of chart, medications, test results, and follow up plan with the patient.     Lyndon CodeFozia M Khan, MD  Internal Medicine

## 2023-01-30 LAB — MICROSCOPIC EXAMINATION
Bacteria, UA: NONE SEEN
Casts: NONE SEEN /lpf
Epithelial Cells (non renal): NONE SEEN /hpf (ref 0–10)
RBC, Urine: NONE SEEN /hpf (ref 0–2)
WBC, UA: NONE SEEN /hpf (ref 0–5)

## 2023-01-30 LAB — UA/M W/RFLX CULTURE, ROUTINE
Bilirubin, UA: NEGATIVE
Glucose, UA: NEGATIVE
Ketones, UA: NEGATIVE
Leukocytes,UA: NEGATIVE
Nitrite, UA: NEGATIVE
Protein,UA: NEGATIVE
RBC, UA: NEGATIVE
Specific Gravity, UA: 1.021 (ref 1.005–1.030)
Urobilinogen, Ur: 0.2 mg/dL (ref 0.2–1.0)
pH, UA: 5.5 (ref 5.0–7.5)

## 2023-04-01 ENCOUNTER — Other Ambulatory Visit: Payer: Self-pay | Admitting: Physician Assistant

## 2023-04-01 DIAGNOSIS — I5032 Chronic diastolic (congestive) heart failure: Secondary | ICD-10-CM

## 2023-05-03 ENCOUNTER — Encounter: Payer: Self-pay | Admitting: Physician Assistant

## 2023-05-03 ENCOUNTER — Ambulatory Visit: Payer: 59 | Admitting: Physician Assistant

## 2023-05-03 VITALS — BP 128/70 | HR 79 | Temp 98.4°F | Resp 16 | Ht 66.0 in | Wt 348.2 lb

## 2023-05-03 DIAGNOSIS — M25561 Pain in right knee: Secondary | ICD-10-CM | POA: Diagnosis not present

## 2023-05-03 NOTE — Progress Notes (Signed)
[facility] [address] [place], [place] [address]  Internal MEDICINE  Office Visit Note  Patient Name: Bradley Velez  [id number]  [id number]  Date of Service: 05/09/2023  Chief Complaint  Patient presents with   Follow-up   Hyperlipidemia   Hypertension   Quality Metric Gaps    Colonoscopy    HPI Pt is here for ED follow up and back to work clearance -In may started having wrist issues and swelled up to elbow and told he had tendonitis and carpal tunnel by urgent care and was put on zanaflex and given wrist brace. While dealing with this his right ankle/leg started bothering him and got to the point he couldn't walk and went to ED when it had moved up into right knee as primary source of pain. Told he had tendonitis and arthritis and was put in a full leg brace for a week. Took this off on Monday. He was given naproxen and has completed this as well. States he was told he could take off brace once done with medication if feeling better and was given name for ortho provider to schedule with if needed -Pain is much better now and needs note to go back to work. He did order a smaller knee brace to see how this does -knows he has OA in this knee.  Current Medication: Outpatient Encounter Medications as of 05/03/2023  Medication Sig   albuterol (VENTOLIN HFA) 108 (90 Base) MCG/ACT inhaler Inhale 2 puffs into the lungs every 6 (six) hours as needed for wheezing or shortness of breath.   carvedilol (COREG) 6.25 MG tablet Take 1 tablet by mouth twice daily   furosemide (LASIX) 80 MG tablet TAKE 1 TABLET BY MOUTH ONCE  DAILY   metFORMIN (GLUCOPHAGE) 500 MG tablet TAKE 1 TABLET BY MOUTH DAILY  WITH BREAKFAST   rosuvastatin (CRESTOR) 10 MG tablet Take 1 tablet (10 mg total) by mouth daily.   No facility-administered encounter medications on file as of 05/03/2023.    Surgical History: Past Surgical History:  Procedure Laterality Date   EYE SURGERY Bilateral 1970    Medical  History: Past Medical History:  Diagnosis Date   Hyperlipidemia    Hypertension    Obesity    Sleep apnea     Family History: Family History  Problem Relation Age of Onset   Emphysema Father    Lung cancer Father     Social History   Socioeconomic History   Marital status: Divorced    Spouse name: Not on file   Number of children: Not on file   Years of education: Not on file   Highest education level: Not on file  Occupational History   Not on file  Tobacco Use   Smoking status: Never   Smokeless tobacco: Never  Substance and Sexual Activity   Alcohol use: Yes    Comment: social   Drug use: No   Sexual activity: Not on file  Other Topics Concern   Not on file  Social History Narrative   Not on file   Social Determinants of Health   Financial Resource Strain: Not on file  Food Insecurity: Not on file  Transportation Needs: Not on file  Physical Activity: Not on file  Stress: Not on file  Social Connections: Not on file  Intimate Partner Violence: Not on file      Review of Systems  Constitutional:  Negative for activity change, chills, fatigue and unexpected weight change.  HENT:  Negative  for congestion, postnasal drip, rhinorrhea, sneezing and sore throat.   Respiratory:  Negative for cough, chest tightness, shortness of breath and wheezing.   Cardiovascular:  Positive for leg swelling. Negative for chest pain and palpitations.  Gastrointestinal:  Negative for abdominal pain, constipation, diarrhea, nausea and vomiting.  Endocrine: Negative for cold intolerance, heat intolerance, polydipsia and polyuria.  Genitourinary:  Negative for dysuria, frequency and urgency.  Musculoskeletal:  Positive for arthralgias. Negative for back pain, joint swelling and neck pain.  Skin:  Negative for rash.  Allergic/Immunologic: Negative for environmental allergies.  Neurological:  Negative for dizziness, tremors, numbness and headaches.  Hematological:  Negative for  adenopathy. Does not bruise/bleed easily.  Psychiatric/Behavioral:  Negative for behavioral problems (Depression), sleep disturbance and suicidal ideas. The patient is not nervous/anxious.     Vital Signs: BP 128/70 Comment: 130/90  Pulse 79   Temp 98.4 F (36.9 C)   Resp 16   Ht 5\' 6"  (1.676 m)   Wt (!) 348 lb 3.2 oz (157.9 kg)   SpO2 94%   BMI 56.20 kg/m    Physical Exam Vitals and nursing note reviewed.  Constitutional:      General: He is not in acute distress.    Appearance: Normal appearance. He is well-developed. He is obese. He is not diaphoretic.  HENT:     Head: Normocephalic and atraumatic.     Nose: Nose normal.     Mouth/Throat:     Pharynx: No oropharyngeal exudate.  Eyes:     Pupils: Pupils are equal, round, and reactive to light.  Neck:     Thyroid: No thyromegaly.     Vascular: No carotid bruit or JVD.     Trachea: No tracheal deviation.  Cardiovascular:     Rate and Rhythm: Normal rate and regular rhythm.     Pulses: Normal pulses.     Heart sounds: Normal heart sounds. No murmur heard.    No friction rub. No gallop.  Pulmonary:     Effort: Pulmonary effort is normal. No respiratory distress.     Breath sounds: Normal breath sounds. No wheezing or rales.  Chest:     Chest wall: No tenderness.  Abdominal:     General: Bowel sounds are normal.     Palpations: Abdomen is soft.     Tenderness: There is no abdominal tenderness.  Musculoskeletal:        General: No tenderness. Normal range of motion.     Cervical back: Normal range of motion and neck supple.     Right lower leg: Edema present.     Left lower leg: Edema present.  Lymphadenopathy:     Cervical: No cervical adenopathy.  Skin:    General: Skin is warm and dry.  Neurological:     General: No focal deficit present.     Mental Status: He is alert and oriented to person, place, and time.     Cranial Nerves: No cranial nerve deficit.  Psychiatric:        Mood and Affect: Mood normal.         Behavior: Behavior normal.        Thought Content: Thought content normal.        Judgment: Judgment normal.        Assessment/Plan: 1. Acute pain of right knee May continue anti-inflammatory as needed and will follow up with ortho. Greatly improved and may return to work   General Counseling: Lan verbalizes understanding of the findings of todays  visit and agrees with plan of treatment. I have discussed any further diagnostic evaluation that may be needed or ordered today. We also reviewed his medications today. he has been encouraged to call the office with any questions or concerns that should arise related to todays visit.    No orders of the defined types were placed in this encounter.   No orders of the defined types were placed in this encounter.   This patient was seen by Lynn Ito, PA-C in collaboration with Dr. Beverely Risen as a part of collaborative care agreement.   Total time spent:30 Minutes Time spent includes review of chart, medications, test results, and follow up plan with the patient.      Dr Lyndon Code Internal medicine

## 2023-05-05 IMAGING — US US EXTREM LOW VENOUS*L*
1 series · 14 of 24 positions shown · non-contrast
Comparison: None.

CLINICAL DATA: Left lower extremity pain and swelling

EXAM:
LEFT LOWER EXTREMITY VENOUS DOPPLER ULTRASOUND
TECHNIQUE: Gray-scale sonography with compression, as well as color and duplex
ultrasound, were performed to evaluate the deep venous system(s)
from the level of the common femoral vein through the popliteal and
proximal calf veins.

[Series 1: us extrem low venous*left* · 0.08mm/px · 14 of 36 slices shown]
[im 1/36]
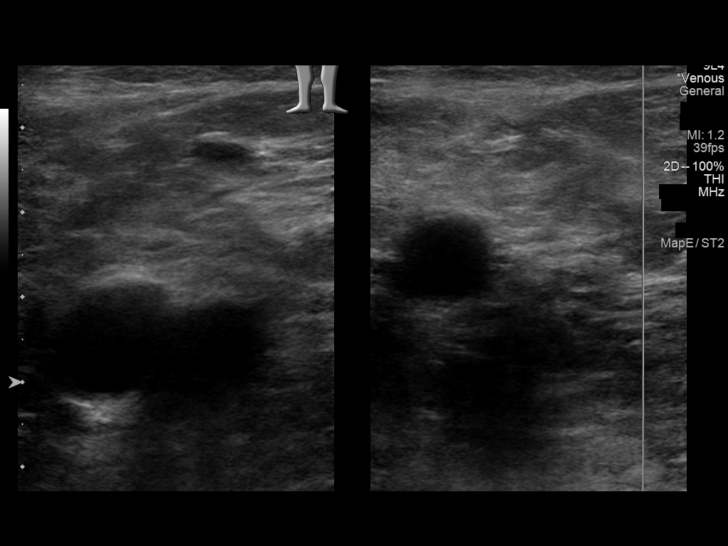
[im 4/36]
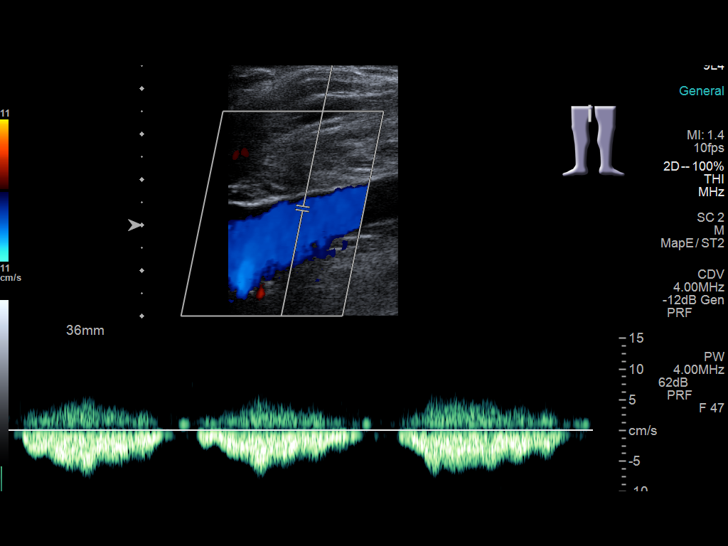
[im 7/36]
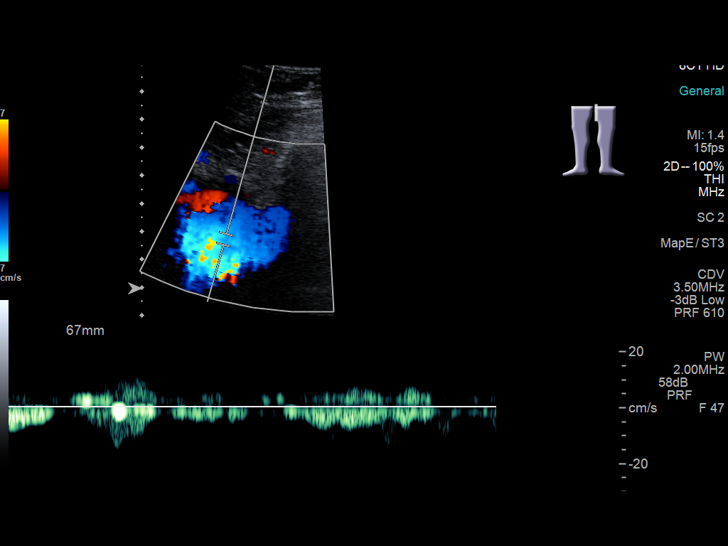
[im 10/36]
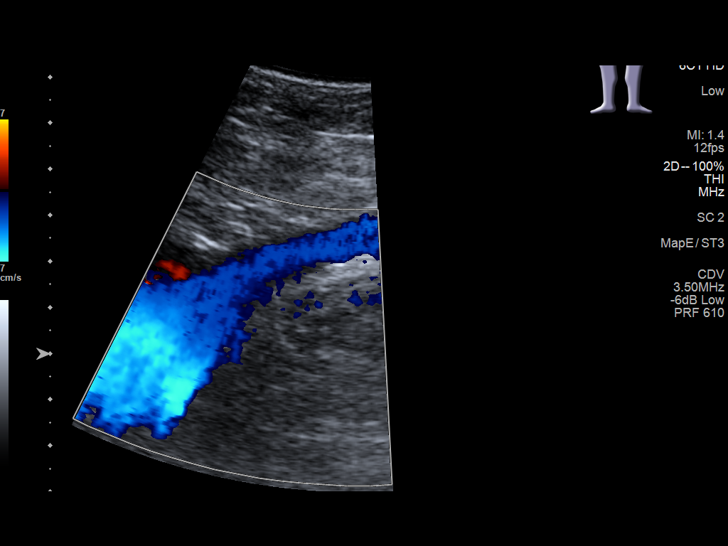
[im 11/36]
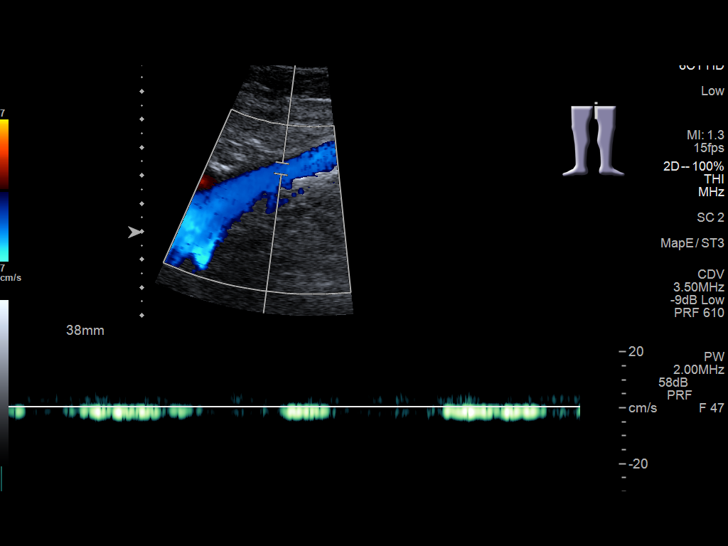
[im 14/36]
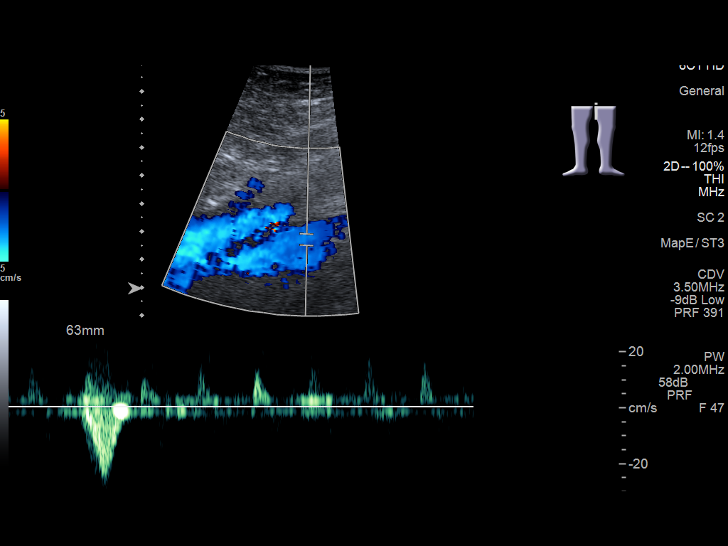
[im 17/36]
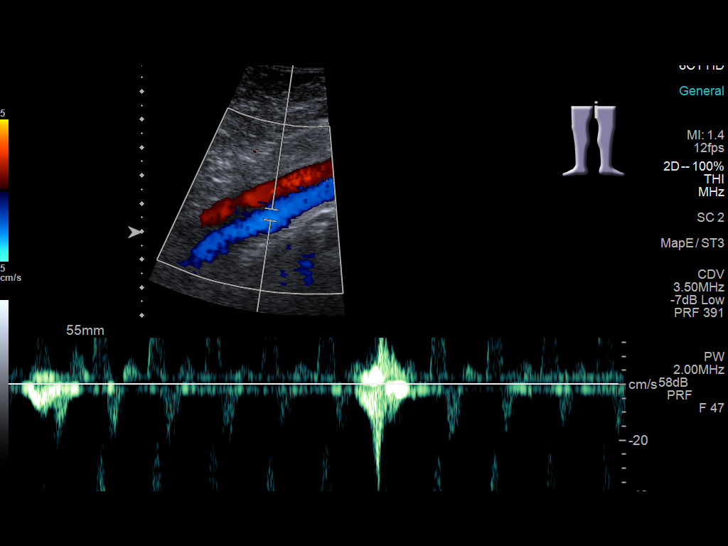
[im 19/36]
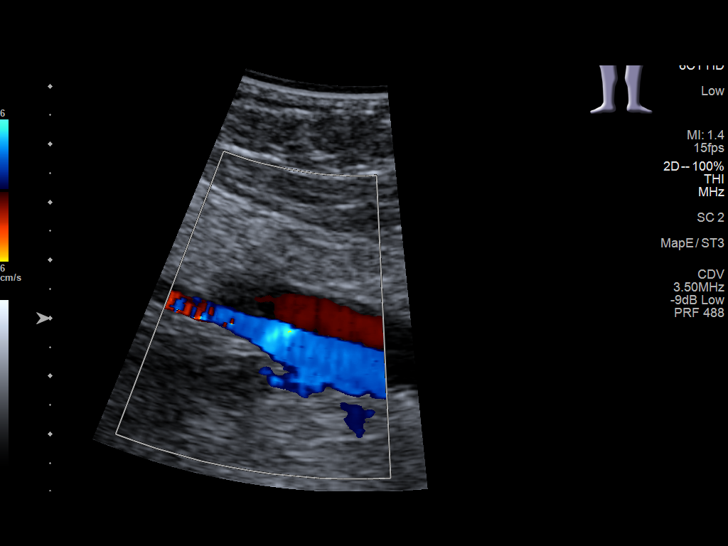
[im 22/36]
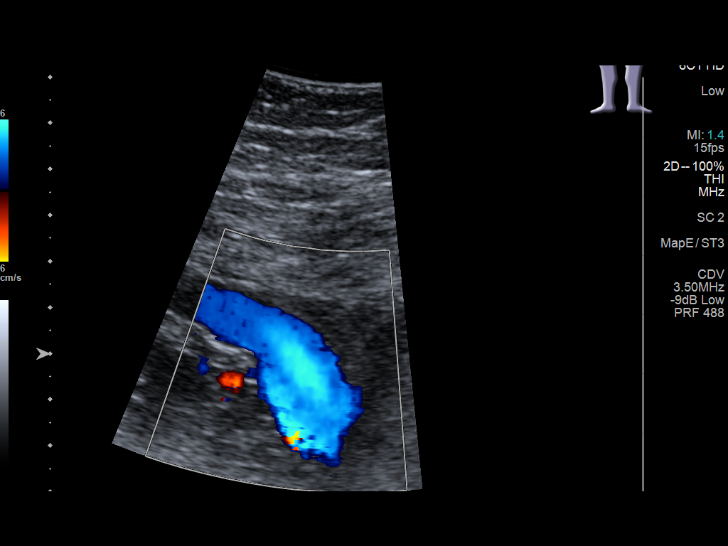
[im 25/36]
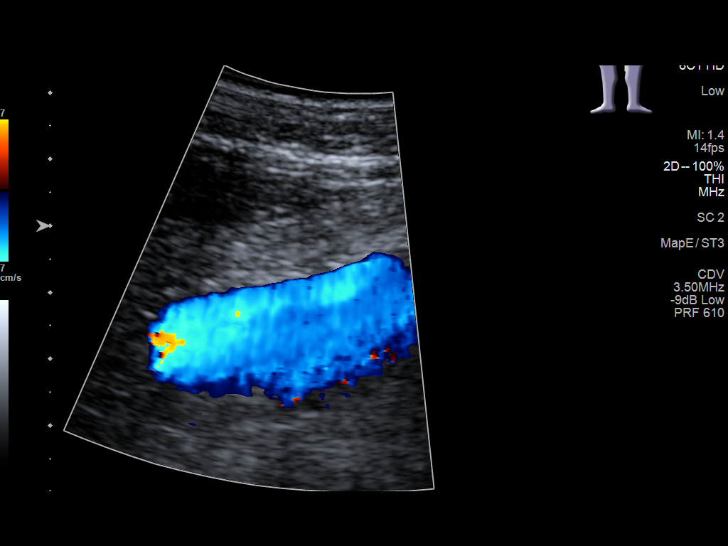
[im 28/36]
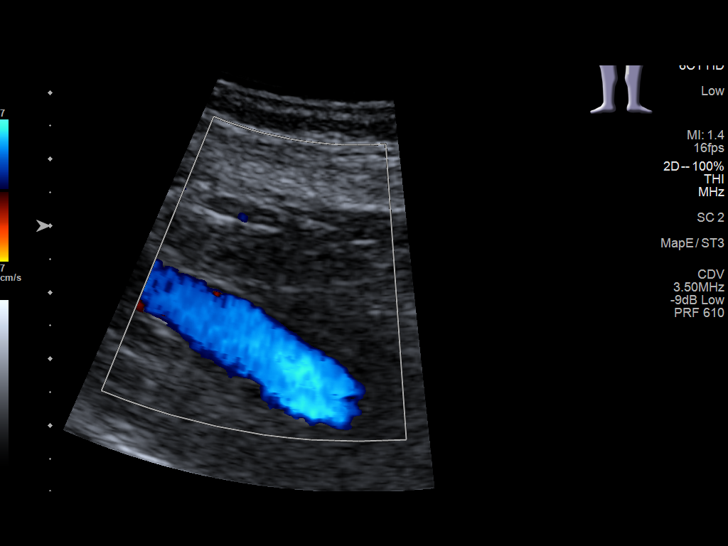
[im 29/36]
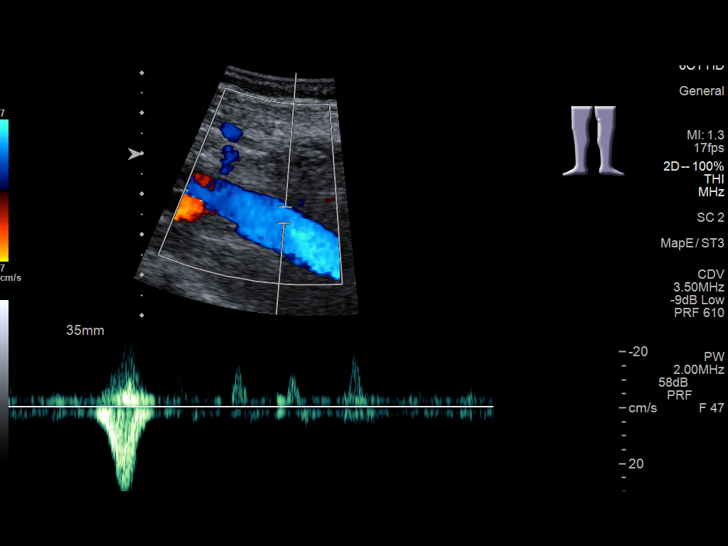
[im 32/36]
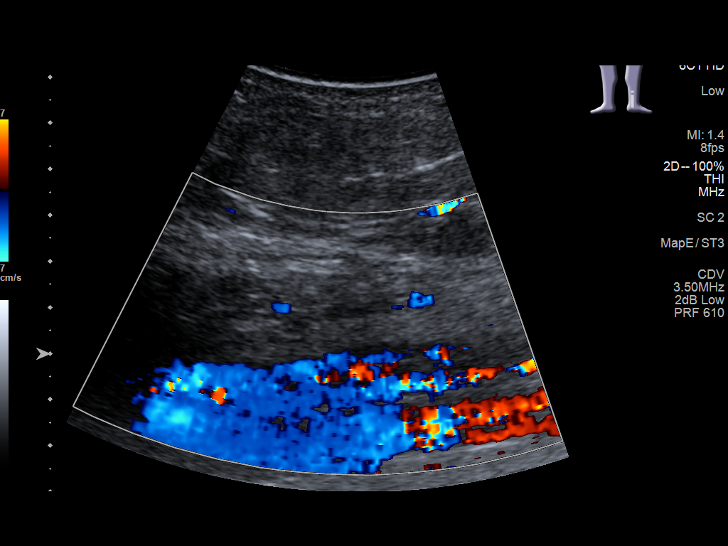
[im 36/36]
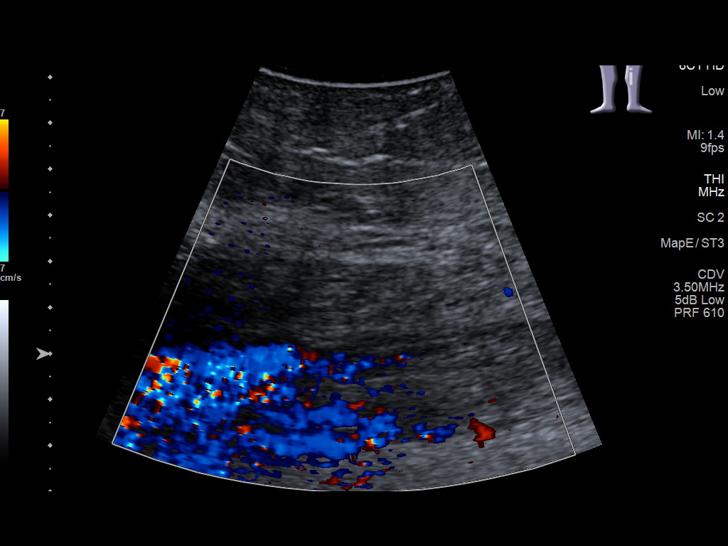

[14 of 24 positions shown; findings below may reference images not displayed]

FINDINGS: VENOUS

Normal compressibility of the common femoral, superficial femoral,
and popliteal veins, as well as the visualized calf veins.
Visualized portions of profunda femoral vein and great saphenous
vein unremarkable. No filling defects to suggest DVT on grayscale or
color Doppler imaging. Doppler waveforms show normal direction of
venous flow, normal respiratory plasticity and response to
augmentation.

Limited views of the contralateral common femoral vein are
unremarkable.

OTHER

None.

Limitations: none
IMPRESSION: Negative.

## 2023-05-14 ENCOUNTER — Other Ambulatory Visit: Payer: Self-pay | Admitting: Physician Assistant

## 2023-05-14 DIAGNOSIS — I10 Essential (primary) hypertension: Secondary | ICD-10-CM

## 2023-05-15 ENCOUNTER — Other Ambulatory Visit: Payer: Self-pay | Admitting: Physician Assistant

## 2023-05-15 DIAGNOSIS — E782 Mixed hyperlipidemia: Secondary | ICD-10-CM

## 2023-05-31 ENCOUNTER — Ambulatory Visit: Payer: 59 | Admitting: Physician Assistant

## 2023-06-21 ENCOUNTER — Encounter: Payer: Self-pay | Admitting: Nurse Practitioner

## 2023-06-21 ENCOUNTER — Ambulatory Visit (INDEPENDENT_AMBULATORY_CARE_PROVIDER_SITE_OTHER): Payer: 59 | Admitting: Nurse Practitioner

## 2023-06-21 VITALS — BP 98/67 | HR 85 | Temp 98.3°F | Resp 16 | Ht 66.0 in | Wt 354.8 lb

## 2023-06-21 DIAGNOSIS — M25561 Pain in right knee: Secondary | ICD-10-CM

## 2023-06-21 NOTE — Progress Notes (Signed)
[facility] [address] [place], [place] [address]  Internal MEDICINE  Office Visit Note  Patient Name: Bradley Velez  [id number]  [id number]  Date of Service: 06/21/2023  Chief Complaint  Patient presents with   Follow-up    Needs work note     HPI Bradley Velez presents for a follow-up visit for right knee pain.   Work note to return to work.-- was out for knee pain, bone on bone, plans to see an orthopedic soon and start the process of working toward knee replacement surgery of the right knee.     Current Medication: Outpatient Encounter Medications as of 06/21/2023  Medication Sig   albuterol (VENTOLIN HFA) 108 (90 Base) MCG/ACT inhaler Inhale 2 puffs into the lungs every 6 (six) hours as needed for wheezing or shortness of breath.   carvedilol (COREG) 6.25 MG tablet TAKE 1 TABLET BY MOUTH TWICE  DAILY   furosemide (LASIX) 80 MG tablet TAKE 1 TABLET BY MOUTH ONCE  DAILY   metFORMIN (GLUCOPHAGE) 500 MG tablet TAKE 1 TABLET BY MOUTH DAILY  WITH BREAKFAST   rosuvastatin (CRESTOR) 10 MG tablet TAKE 1 TABLET BY MOUTH DAILY   No facility-administered encounter medications on file as of 06/21/2023.    Surgical History: Past Surgical History:  Procedure Laterality Date   EYE SURGERY Bilateral 1970    Medical History: Past Medical History:  Diagnosis Date   Hyperlipidemia    Hypertension    Obesity    Sleep apnea     Family History: Family History  Problem Relation Age of Onset   Emphysema Father    Lung cancer Father     Social History   Socioeconomic History   Marital status: Divorced    Spouse name: Not on file   Number of children: Not on file   Years of education: Not on file   Highest education level: Not on file  Occupational History   Not on file  Tobacco Use   Smoking status: Never   Smokeless tobacco: Never  Substance and Sexual Activity   Alcohol use: Yes    Comment: social   Drug use: No   Sexual activity: Not on file  Other  Topics Concern   Not on file  Social History Narrative   Not on file   Social Determinants of Health   Financial Resource Strain: Not on file  Food Insecurity: Not on file  Transportation Needs: Not on file  Physical Activity: Not on file  Stress: Not on file  Social Connections: Not on file  Intimate Partner Violence: Not on file      Review of Systems  Constitutional:  Negative for activity change, chills, fatigue and unexpected weight change.  HENT:  Negative for congestion, postnasal drip, rhinorrhea, sneezing and sore throat.   Respiratory:  Negative for cough, chest tightness, shortness of breath and wheezing.   Cardiovascular:  Positive for leg swelling. Negative for chest pain and palpitations.  Gastrointestinal:  Negative for abdominal pain, constipation, diarrhea, nausea and vomiting.  Endocrine: Negative for cold intolerance, heat intolerance, polydipsia and polyuria.  Genitourinary:  Negative for dysuria, frequency and urgency.  Musculoskeletal:  Positive for arthralgias. Negative for back pain, joint swelling and neck pain.  Skin:  Negative for rash.  Allergic/Immunologic: Negative for environmental allergies.  Neurological:  Negative for dizziness, tremors, numbness and headaches.  Hematological:  Negative for adenopathy. Does not bruise/bleed easily.  Psychiatric/Behavioral:  Negative for behavioral problems (Depression), sleep disturbance and suicidal ideas. The patient  is not nervous/anxious.     Vital Signs: BP 98/67   Pulse 85   Temp 98.3 F (36.8 C)   Resp 16   Ht 5\' 6"  (1.676 m)   Wt (!) 354 lb 12.8 oz (160.9 kg)   SpO2 94%   BMI 57.27 kg/m    Physical Exam Vitals and nursing note reviewed.  Constitutional:      General: He is not in acute distress.    Appearance: Normal appearance. He is well-developed. He is obese. He is not diaphoretic.  HENT:     Head: Normocephalic and atraumatic.     Nose: Nose normal.     Mouth/Throat:     Pharynx:  No oropharyngeal exudate.  Eyes:     Pupils: Pupils are equal, round, and reactive to light.  Neck:     Thyroid: No thyromegaly.     Vascular: No carotid bruit or JVD.     Trachea: No tracheal deviation.  Cardiovascular:     Rate and Rhythm: Normal rate and regular rhythm.     Pulses: Normal pulses.     Heart sounds: Normal heart sounds. No murmur heard.    No friction rub. No gallop.  Pulmonary:     Effort: Pulmonary effort is normal. No respiratory distress.     Breath sounds: Normal breath sounds. No wheezing or rales.  Chest:     Chest wall: No tenderness.  Abdominal:     General: Bowel sounds are normal.     Palpations: Abdomen is soft.     Tenderness: There is no abdominal tenderness.  Musculoskeletal:        General: No tenderness. Normal range of motion.     Cervical back: Normal range of motion and neck supple.     Right lower leg: Edema present.     Left lower leg: Edema present.  Lymphadenopathy:     Cervical: No cervical adenopathy.  Skin:    General: Skin is warm and dry.  Neurological:     General: No focal deficit present.     Mental Status: He is alert and oriented to person, place, and time.     Cranial Nerves: No cranial nerve deficit.  Psychiatric:        Mood and Affect: Mood normal.        Behavior: Behavior normal.        Thought Content: Thought content normal.        Judgment: Judgment normal.        Assessment/Plan: 1. Acute pain of right knee May return to work with no restrictions. Plans to see orthopedic soon to be evaluated for knee replacement surgery of the right knee.    General Counseling: Bradley Velez understanding of the findings of todays visit and agrees with plan of treatment. I have discussed any further diagnostic evaluation that may be needed or ordered today. We also reviewed his medications today. he has been encouraged to call the office with any questions or concerns that should arise related to todays  visit.    No orders of the defined types were placed in this encounter.   No orders of the defined types were placed in this encounter.   Return if symptoms worsen or fail to improve, for and also routine follow up with lauren pcp.   Total time spent:20 Minutes Time spent includes review of chart, medications, test results, and follow up plan with the patient.   [place] Controlled Substance Database was reviewed by me.  This patient was seen by Sallyanne Kuster, FNP-C in collaboration with Dr. Beverely Risen as a part of collaborative care agreement.   Barlow Harrison R. Tedd Sias, MSN, FNP-C Internal medicine

## 2023-07-09 ENCOUNTER — Ambulatory Visit: Payer: 59 | Admitting: Physician Assistant

## 2023-10-24 DIAGNOSIS — I5023 Acute on chronic systolic (congestive) heart failure: Secondary | ICD-10-CM | POA: Diagnosis not present

## 2023-10-24 DIAGNOSIS — E662 Morbid (severe) obesity with alveolar hypoventilation: Secondary | ICD-10-CM | POA: Diagnosis not present

## 2023-10-24 DIAGNOSIS — J205 Acute bronchitis due to respiratory syncytial virus: Secondary | ICD-10-CM | POA: Diagnosis not present

## 2023-10-24 DIAGNOSIS — J121 Respiratory syncytial virus pneumonia: Secondary | ICD-10-CM | POA: Diagnosis not present

## 2023-10-24 DIAGNOSIS — I110 Hypertensive heart disease with heart failure: Secondary | ICD-10-CM | POA: Diagnosis not present

## 2023-10-24 DIAGNOSIS — J9601 Acute respiratory failure with hypoxia: Secondary | ICD-10-CM | POA: Diagnosis not present

## 2023-10-24 DIAGNOSIS — J210 Acute bronchiolitis due to respiratory syncytial virus: Secondary | ICD-10-CM | POA: Diagnosis not present

## 2023-10-24 DIAGNOSIS — I509 Heart failure, unspecified: Secondary | ICD-10-CM | POA: Diagnosis not present

## 2023-10-24 DIAGNOSIS — Z79899 Other long term (current) drug therapy: Secondary | ICD-10-CM | POA: Diagnosis not present

## 2023-10-24 DIAGNOSIS — Z6841 Body Mass Index (BMI) 40.0 and over, adult: Secondary | ICD-10-CM | POA: Diagnosis not present

## 2023-10-24 DIAGNOSIS — I429 Cardiomyopathy, unspecified: Secondary | ICD-10-CM | POA: Diagnosis not present

## 2023-10-24 DIAGNOSIS — I2510 Atherosclerotic heart disease of native coronary artery without angina pectoris: Secondary | ICD-10-CM | POA: Diagnosis not present

## 2023-10-24 DIAGNOSIS — E119 Type 2 diabetes mellitus without complications: Secondary | ICD-10-CM | POA: Diagnosis not present

## 2023-10-24 DIAGNOSIS — J1108 Influenza due to unidentified influenza virus with specified pneumonia: Secondary | ICD-10-CM | POA: Diagnosis not present

## 2023-10-24 DIAGNOSIS — E785 Hyperlipidemia, unspecified: Secondary | ICD-10-CM | POA: Diagnosis not present

## 2023-10-24 DIAGNOSIS — Z7984 Long term (current) use of oral hypoglycemic drugs: Secondary | ICD-10-CM | POA: Diagnosis not present

## 2023-10-24 DIAGNOSIS — J9811 Atelectasis: Secondary | ICD-10-CM | POA: Diagnosis not present

## 2023-10-29 ENCOUNTER — Ambulatory Visit (INDEPENDENT_AMBULATORY_CARE_PROVIDER_SITE_OTHER): Payer: 59 | Admitting: Physician Assistant

## 2023-10-29 ENCOUNTER — Encounter: Payer: Self-pay | Admitting: Physician Assistant

## 2023-10-29 VITALS — BP 120/70 | HR 93 | Temp 98.4°F | Resp 16 | Ht 66.0 in | Wt 344.0 lb

## 2023-10-29 DIAGNOSIS — J9811 Atelectasis: Secondary | ICD-10-CM | POA: Diagnosis not present

## 2023-10-29 DIAGNOSIS — E782 Mixed hyperlipidemia: Secondary | ICD-10-CM | POA: Diagnosis not present

## 2023-10-29 DIAGNOSIS — Z125 Encounter for screening for malignant neoplasm of prostate: Secondary | ICD-10-CM

## 2023-10-29 DIAGNOSIS — Z09 Encounter for follow-up examination after completed treatment for conditions other than malignant neoplasm: Secondary | ICD-10-CM

## 2023-10-29 DIAGNOSIS — R7303 Prediabetes: Secondary | ICD-10-CM | POA: Diagnosis not present

## 2023-10-29 DIAGNOSIS — I5032 Chronic diastolic (congestive) heart failure: Secondary | ICD-10-CM | POA: Diagnosis not present

## 2023-10-29 DIAGNOSIS — Z1329 Encounter for screening for other suspected endocrine disorder: Secondary | ICD-10-CM

## 2023-10-29 DIAGNOSIS — R5383 Other fatigue: Secondary | ICD-10-CM

## 2023-10-29 MED ORDER — METFORMIN HCL 500 MG PO TABS: 500.0000 mg | ORAL_TABLET | Freq: Every day | ORAL | 3 refills | Status: DC

## 2023-10-29 MED ORDER — FUROSEMIDE 80 MG PO TABS: ORAL_TABLET | ORAL | 3 refills | Status: DC

## 2023-10-29 MED ORDER — ROSUVASTATIN CALCIUM 10 MG PO TABS: 10.0000 mg | ORAL_TABLET | Freq: Every day | ORAL | 3 refills | Status: DC

## 2023-10-29 NOTE — Progress Notes (Signed)
 [facility] [address] [place], [place] [address]  Internal MEDICINE  Office Visit Note  Patient Name: Bradley Velez  [id number]  [id number]  Date of Service: 10/31/2023     Chief Complaint  Patient presents with   Hospitalization Follow-up   Hypertension   Hyperlipidemia   Quality Metric Gaps    Colonoscopy and Shingles Vaccine     HPI Pt is here for recent hospital follow up. Avi to ED and was admitted on 10/19/23 with SOB at [facility], discharged 10/25/23 -Dx with acute CHF exacerbation with RSV pneumonitis -pt reports feeling SOB and having excessive coughing before going to ED.  -based on previous CXR findings, suggested to have follow up PA and lateral CXR in 3-4 weeks from 12/27, will place order. If not improved would need pulmonology consult and further work up. -pt has follow up with cardiology 11/01/23 and will discuss entresto cost. EF reduced on recent echo. -doing better now, but still a little tired and weak. Breathing much better. Does feel well enough to resume work and requests clearance note. -Weaned off oxygen before discharge, wearing cpap at night as before  Current Medication: Outpatient Encounter Medications as of 10/29/2023  Medication Sig   albuterol  (VENTOLIN  HFA) 108 (90 Base) MCG/ACT inhaler Inhale 2 puffs into the lungs every 6 (six) hours as needed for wheezing or shortness of breath.   guaifenesin  (ROBITUSSIN) 100 MG/5ML syrup Take 200 mg by mouth 3 (three) times daily as needed for cough.   metoprolol succinate (TOPROL-XL) 25 MG 24 hr tablet Take 25 mg by mouth daily.   sacubitril-valsartan (ENTRESTO) 24-26 MG Take 1 tablet by mouth 2 (two) times daily.   spironolactone (ALDACTONE) 25 MG tablet Take 25 mg by mouth daily.   [DISCONTINUED] carvedilol  (COREG ) 6.25 MG tablet TAKE 1 TABLET BY MOUTH TWICE  DAILY   [DISCONTINUED] furosemide  (LASIX ) 80 MG tablet TAKE 1 TABLET BY MOUTH ONCE  DAILY   [DISCONTINUED]  metFORMIN  (GLUCOPHAGE ) 500 MG tablet TAKE 1 TABLET BY MOUTH DAILY  WITH BREAKFAST   [DISCONTINUED] rosuvastatin  (CRESTOR ) 10 MG tablet TAKE 1 TABLET BY MOUTH DAILY   furosemide  (LASIX ) 80 MG tablet TAKE 1 TABLET BY MOUTH ONCE DAILY   metFORMIN  (GLUCOPHAGE ) 500 MG tablet Take 1 tablet (500 mg total) by mouth daily with breakfast.   rosuvastatin  (CRESTOR ) 10 MG tablet Take 1 tablet (10 mg total) by mouth daily.   No facility-administered encounter medications on file as of 10/29/2023.    Surgical History: Past Surgical History:  Procedure Laterality Date   EYE SURGERY Bilateral 1970    Medical History: Past Medical History:  Diagnosis Date   Hyperlipidemia    Hypertension    Obesity    Sleep apnea     Family History: Family History  Problem Relation Age of Onset   Emphysema Father    Lung cancer Father     Social History   Socioeconomic History   Marital status: Divorced    Spouse name: Not on file   Number of children: Not on file   Years of education: Not on file   Highest education level: Not on file  Occupational History   Not on file  Tobacco Use   Smoking status: Never   Smokeless tobacco: Never  Substance and Sexual Activity   Alcohol use: Yes    Comment: social   Drug use: No   Sexual activity: Not on file  Other Topics Concern   Not on file  Social History Narrative   Not on file   Social Drivers of Health   Financial Resource Strain: Not on file  Food Insecurity: Low Risk  (10/20/2023)   Received from [facility]   Hunger Vital Sign    Worried About Running Out of Food in the Last Year: Never true    Ran Out of Food in the Last Year: Never true  Transportation Needs: No Transportation Needs (10/20/2023)   Received from [facility]    In the past 12 months, has lack of reliable transportation kept you from medical appointments, meetings, work or from getting things needed for daily living? : No  Physical Activity: Not on file   Stress: Not on file  Social Connections: Not on file  Intimate Partner Violence: Not on file      Review of Systems  Constitutional:  Positive for fatigue. Negative for activity change, chills and unexpected weight change.  HENT:  Negative for congestion, postnasal drip, rhinorrhea, sneezing and sore throat.   Respiratory:  Negative for cough, chest tightness, shortness of breath and wheezing.   Cardiovascular:  Positive for leg swelling. Negative for chest pain and palpitations.  Gastrointestinal:  Negative for abdominal pain, constipation, diarrhea, nausea and vomiting.  Endocrine: Negative for cold intolerance, heat intolerance, polydipsia and polyuria.  Genitourinary:  Negative for dysuria, frequency and urgency.  Musculoskeletal:  Positive for arthralgias. Negative for back pain, joint swelling and neck pain.  Skin:  Negative for rash.  Allergic/Immunologic: Negative for environmental allergies.  Neurological:  Negative for dizziness, tremors, numbness and headaches.  Hematological:  Negative for adenopathy. Does not bruise/bleed easily.  Psychiatric/Behavioral:  Negative for behavioral problems (Depression), sleep disturbance and suicidal ideas. The patient is not nervous/anxious.     Vital Signs: BP 120/70   Pulse 93   Temp 98.4 F (36.9 C)   Resp 16   Ht 5' 6 (1.676 m)   Wt (!) 344 lb (156 kg)   SpO2 97%   BMI 55.52 kg/m    Physical Exam Vitals and nursing note reviewed.  Constitutional:      General: He is not in acute distress.    Appearance: Normal appearance. He is well-developed. He is obese. He is not diaphoretic.  HENT:     Head: Normocephalic and atraumatic.     Nose: Nose normal.     Mouth/Throat:     Pharynx: No oropharyngeal exudate.  Eyes:     Pupils: Pupils are equal, round, and reactive to light.  Neck:     Thyroid : No thyromegaly.     Vascular: No carotid bruit or JVD.     Trachea: No tracheal deviation.  Cardiovascular:     Rate and  Rhythm: Normal rate and regular rhythm.     Pulses: Normal pulses.     Heart sounds: Normal heart sounds. No murmur heard.    No friction rub. No gallop.  Pulmonary:     Effort: Pulmonary effort is normal. No respiratory distress.     Breath sounds: Normal breath sounds. No wheezing or rales.  Chest:     Chest wall: No tenderness.  Abdominal:     General: Bowel sounds are normal.     Palpations: Abdomen is soft.     Tenderness: There is no abdominal tenderness.  Musculoskeletal:        General: No tenderness. Normal range of motion.     Cervical back: Normal range of motion and neck supple.  Lymphadenopathy:  Cervical: No cervical adenopathy.  Skin:    General: Skin is warm and dry.  Neurological:     General: No focal deficit present.     Mental Status: He is alert and oriented to person, place, and time.     Cranial Nerves: No cranial nerve deficit.  Psychiatric:        Mood and Affect: Mood normal.        Behavior: Behavior normal.        Thought Content: Thought content normal.        Judgment: Judgment normal.       Assessment/Plan: 1. Hospital discharge follow-up (Primary) Reviewed hospital course and medication changes  2. Atelectasis of right lung Will order follow up chest xray to be completed in ~2 weeks to ensure improvement as suggested in discharge summary. If not improving will likely need pulmonology consult as bronchoscopy was being considered during IP consult - DG Chest 2 View; Future  3. Congestive heart failure with left ventricular diastolic dysfunction, chronic (HCC) Will have cardiology visit this week as planned - furosemide  (LASIX ) 80 MG tablet; TAKE 1 TABLET BY MOUTH ONCE DAILY  Dispense: 90 tablet; Refill: 3  4. Prediabetes Med refills sent, will recheck A1c with labs - metFORMIN  (GLUCOPHAGE ) 500 MG tablet; Take 1 tablet (500 mg total) by mouth daily with breakfast.  Dispense: 90 tablet; Refill: 3 - Hgb A1C w/o eAG  5. Mixed  hyperlipidemia Continue crestor  and will update labs - rosuvastatin  (CRESTOR ) 10 MG tablet; Take 1 tablet (10 mg total) by mouth daily.  Dispense: 90 tablet; Refill: 3 - Lipid Panel With LDL/HDL Ratio  6. Thyroid  disorder screen - TSH + free T4  7. Special screening, prostate cancer - PSA Total (Reflex To Free)  8. Other fatigue - CBC w/Diff/Platelet - Comprehensive metabolic panel - TSH + free T4 - Lipid Panel With LDL/HDL Ratio - Hgb A1C w/o eAG - PSA Total (Reflex To Free)   General Counseling: Curtistine oakland understanding of the findings of todays visit and agrees with plan of treatment. I have discussed any further diagnostic evaluation that may be needed or ordered today. We also reviewed his medications today. he has been encouraged to call the office with any questions or concerns that should arise related to todays visit.    Counseling:    Orders Placed This Encounter  Procedures   DG Chest 2 View   CBC w/Diff/Platelet   Comprehensive metabolic panel   TSH + free T4   Lipid Panel With LDL/HDL Ratio   Hgb A1C w/o eAG   PSA Total (Reflex To Free)    This patient was seen by Tinnie Pro, PA-C in collaboration with Dr. Sigrid Bathe as a part of collaborative care agreement.   I have reviewed all medical records from hospital follow up including radiology reports and consults from other physicians. Appropriate follow up diagnostics will be scheduled as needed. Patient/ Family understands the plan of treatment. Time spent 35 minutes.   Dr Sigrid CHRISTELLA Bathe, MD Internal Medicine

## 2023-11-01 DIAGNOSIS — G4733 Obstructive sleep apnea (adult) (pediatric): Secondary | ICD-10-CM | POA: Diagnosis not present

## 2023-11-01 DIAGNOSIS — I110 Hypertensive heart disease with heart failure: Secondary | ICD-10-CM | POA: Diagnosis not present

## 2023-11-01 DIAGNOSIS — Z79899 Other long term (current) drug therapy: Secondary | ICD-10-CM | POA: Diagnosis not present

## 2023-11-01 DIAGNOSIS — I5023 Acute on chronic systolic (congestive) heart failure: Secondary | ICD-10-CM | POA: Diagnosis not present

## 2024-01-31 ENCOUNTER — Encounter: Payer: Self-pay | Admitting: Physician Assistant

## 2024-03-09 DIAGNOSIS — J9601 Acute respiratory failure with hypoxia: Secondary | ICD-10-CM | POA: Diagnosis not present

## 2024-03-09 DIAGNOSIS — R278 Other lack of coordination: Secondary | ICD-10-CM | POA: Diagnosis not present

## 2024-03-09 DIAGNOSIS — I110 Hypertensive heart disease with heart failure: Secondary | ICD-10-CM | POA: Diagnosis not present

## 2024-03-09 DIAGNOSIS — L03115 Cellulitis of right lower limb: Secondary | ICD-10-CM | POA: Diagnosis not present

## 2024-03-09 DIAGNOSIS — M7652 Patellar tendinitis, left knee: Secondary | ICD-10-CM | POA: Diagnosis not present

## 2024-03-09 DIAGNOSIS — I509 Heart failure, unspecified: Secondary | ICD-10-CM | POA: Diagnosis not present

## 2024-03-09 DIAGNOSIS — I952 Hypotension due to drugs: Secondary | ICD-10-CM | POA: Diagnosis not present

## 2024-03-09 DIAGNOSIS — Z7409 Other reduced mobility: Secondary | ICD-10-CM | POA: Diagnosis not present

## 2024-03-09 DIAGNOSIS — I493 Ventricular premature depolarization: Secondary | ICD-10-CM | POA: Diagnosis not present

## 2024-03-09 DIAGNOSIS — I10 Essential (primary) hypertension: Secondary | ICD-10-CM | POA: Diagnosis not present

## 2024-03-09 DIAGNOSIS — R931 Abnormal findings on diagnostic imaging of heart and coronary circulation: Secondary | ICD-10-CM | POA: Diagnosis not present

## 2024-03-09 DIAGNOSIS — Z6841 Body Mass Index (BMI) 40.0 and over, adult: Secondary | ICD-10-CM | POA: Diagnosis not present

## 2024-03-09 DIAGNOSIS — M25462 Effusion, left knee: Secondary | ICD-10-CM | POA: Diagnosis not present

## 2024-03-09 DIAGNOSIS — M1712 Unilateral primary osteoarthritis, left knee: Secondary | ICD-10-CM | POA: Diagnosis not present

## 2024-03-09 DIAGNOSIS — E785 Hyperlipidemia, unspecified: Secondary | ICD-10-CM | POA: Diagnosis not present

## 2024-03-09 DIAGNOSIS — J210 Acute bronchiolitis due to respiratory syncytial virus: Secondary | ICD-10-CM | POA: Diagnosis not present

## 2024-03-09 DIAGNOSIS — I5023 Acute on chronic systolic (congestive) heart failure: Secondary | ICD-10-CM | POA: Diagnosis not present

## 2024-03-09 DIAGNOSIS — Z91148 Patient's other noncompliance with medication regimen for other reason: Secondary | ICD-10-CM | POA: Diagnosis not present

## 2024-03-09 DIAGNOSIS — M25562 Pain in left knee: Secondary | ICD-10-CM | POA: Diagnosis not present

## 2024-03-09 DIAGNOSIS — I428 Other cardiomyopathies: Secondary | ICD-10-CM | POA: Diagnosis not present

## 2024-03-09 DIAGNOSIS — I340 Nonrheumatic mitral (valve) insufficiency: Secondary | ICD-10-CM | POA: Diagnosis not present

## 2024-03-09 DIAGNOSIS — R918 Other nonspecific abnormal finding of lung field: Secondary | ICD-10-CM | POA: Diagnosis not present

## 2024-03-09 DIAGNOSIS — I080 Rheumatic disorders of both mitral and aortic valves: Secondary | ICD-10-CM | POA: Diagnosis not present

## 2024-03-09 DIAGNOSIS — R600 Localized edema: Secondary | ICD-10-CM | POA: Diagnosis not present

## 2024-03-09 DIAGNOSIS — R0600 Dyspnea, unspecified: Secondary | ICD-10-CM | POA: Diagnosis not present

## 2024-03-09 DIAGNOSIS — G4733 Obstructive sleep apnea (adult) (pediatric): Secondary | ICD-10-CM | POA: Diagnosis not present

## 2024-03-09 DIAGNOSIS — R262 Difficulty in walking, not elsewhere classified: Secondary | ICD-10-CM | POA: Diagnosis not present

## 2024-03-09 DIAGNOSIS — I77810 Thoracic aortic ectasia: Secondary | ICD-10-CM | POA: Diagnosis not present

## 2024-03-09 DIAGNOSIS — J918 Pleural effusion in other conditions classified elsewhere: Secondary | ICD-10-CM | POA: Diagnosis not present

## 2024-03-09 DIAGNOSIS — I5041 Acute combined systolic (congestive) and diastolic (congestive) heart failure: Secondary | ICD-10-CM | POA: Diagnosis not present

## 2024-03-09 DIAGNOSIS — I350 Nonrheumatic aortic (valve) stenosis: Secondary | ICD-10-CM | POA: Diagnosis not present

## 2024-03-09 DIAGNOSIS — I5043 Acute on chronic combined systolic (congestive) and diastolic (congestive) heart failure: Secondary | ICD-10-CM | POA: Diagnosis not present

## 2024-03-09 DIAGNOSIS — I517 Cardiomegaly: Secondary | ICD-10-CM | POA: Diagnosis not present

## 2024-03-09 DIAGNOSIS — L853 Xerosis cutis: Secondary | ICD-10-CM | POA: Diagnosis not present

## 2024-03-09 DIAGNOSIS — I890 Lymphedema, not elsewhere classified: Secondary | ICD-10-CM | POA: Diagnosis not present

## 2024-03-09 DIAGNOSIS — R0902 Hypoxemia: Secondary | ICD-10-CM | POA: Diagnosis not present

## 2024-03-09 DIAGNOSIS — I272 Pulmonary hypertension, unspecified: Secondary | ICD-10-CM | POA: Diagnosis not present

## 2024-03-09 DIAGNOSIS — R531 Weakness: Secondary | ICD-10-CM | POA: Diagnosis not present

## 2024-03-09 DIAGNOSIS — I361 Nonrheumatic tricuspid (valve) insufficiency: Secondary | ICD-10-CM | POA: Diagnosis not present

## 2024-03-09 DIAGNOSIS — E876 Hypokalemia: Secondary | ICD-10-CM | POA: Diagnosis not present

## 2024-03-09 DIAGNOSIS — I472 Ventricular tachycardia, unspecified: Secondary | ICD-10-CM | POA: Diagnosis not present

## 2024-03-09 DIAGNOSIS — R0602 Shortness of breath: Secondary | ICD-10-CM | POA: Diagnosis not present

## 2024-03-09 DIAGNOSIS — I491 Atrial premature depolarization: Secondary | ICD-10-CM | POA: Diagnosis not present

## 2024-03-09 DIAGNOSIS — R0689 Other abnormalities of breathing: Secondary | ICD-10-CM | POA: Diagnosis not present

## 2024-03-09 DIAGNOSIS — R609 Edema, unspecified: Secondary | ICD-10-CM | POA: Diagnosis not present

## 2024-03-09 DIAGNOSIS — R7303 Prediabetes: Secondary | ICD-10-CM | POA: Diagnosis not present

## 2024-03-09 DIAGNOSIS — L03116 Cellulitis of left lower limb: Secondary | ICD-10-CM | POA: Diagnosis not present

## 2024-03-09 DIAGNOSIS — M10062 Idiopathic gout, left knee: Secondary | ICD-10-CM | POA: Diagnosis not present

## 2024-03-09 DIAGNOSIS — M6281 Muscle weakness (generalized): Secondary | ICD-10-CM | POA: Diagnosis not present

## 2024-03-09 DIAGNOSIS — Z743 Need for continuous supervision: Secondary | ICD-10-CM | POA: Diagnosis not present

## 2024-03-09 DIAGNOSIS — G2581 Restless legs syndrome: Secondary | ICD-10-CM | POA: Diagnosis not present

## 2024-03-09 DIAGNOSIS — I872 Venous insufficiency (chronic) (peripheral): Secondary | ICD-10-CM | POA: Diagnosis not present

## 2024-03-09 DIAGNOSIS — R42 Dizziness and giddiness: Secondary | ICD-10-CM | POA: Diagnosis not present

## 2024-03-21 DIAGNOSIS — M10062 Idiopathic gout, left knee: Secondary | ICD-10-CM | POA: Diagnosis not present

## 2024-03-21 DIAGNOSIS — E876 Hypokalemia: Secondary | ICD-10-CM | POA: Diagnosis not present

## 2024-03-21 DIAGNOSIS — E785 Hyperlipidemia, unspecified: Secondary | ICD-10-CM | POA: Diagnosis not present

## 2024-03-21 DIAGNOSIS — I10 Essential (primary) hypertension: Secondary | ICD-10-CM | POA: Diagnosis not present

## 2024-03-21 DIAGNOSIS — I509 Heart failure, unspecified: Secondary | ICD-10-CM | POA: Diagnosis not present

## 2024-03-21 DIAGNOSIS — R7303 Prediabetes: Secondary | ICD-10-CM | POA: Diagnosis not present

## 2024-03-21 DIAGNOSIS — R278 Other lack of coordination: Secondary | ICD-10-CM | POA: Diagnosis not present

## 2024-03-21 DIAGNOSIS — R262 Difficulty in walking, not elsewhere classified: Secondary | ICD-10-CM | POA: Diagnosis not present

## 2024-03-21 DIAGNOSIS — G2581 Restless legs syndrome: Secondary | ICD-10-CM | POA: Diagnosis not present

## 2024-03-21 DIAGNOSIS — L853 Xerosis cutis: Secondary | ICD-10-CM | POA: Diagnosis not present

## 2024-03-21 DIAGNOSIS — I77810 Thoracic aortic ectasia: Secondary | ICD-10-CM | POA: Diagnosis not present

## 2024-03-21 DIAGNOSIS — Z6841 Body Mass Index (BMI) 40.0 and over, adult: Secondary | ICD-10-CM | POA: Diagnosis not present

## 2024-03-21 DIAGNOSIS — G4733 Obstructive sleep apnea (adult) (pediatric): Secondary | ICD-10-CM | POA: Diagnosis not present

## 2024-03-21 DIAGNOSIS — I350 Nonrheumatic aortic (valve) stenosis: Secondary | ICD-10-CM | POA: Diagnosis not present

## 2024-03-21 DIAGNOSIS — R531 Weakness: Secondary | ICD-10-CM | POA: Diagnosis not present

## 2024-03-21 DIAGNOSIS — Z743 Need for continuous supervision: Secondary | ICD-10-CM | POA: Diagnosis not present

## 2024-03-21 DIAGNOSIS — I5041 Acute combined systolic (congestive) and diastolic (congestive) heart failure: Secondary | ICD-10-CM | POA: Diagnosis not present

## 2024-03-21 DIAGNOSIS — M6281 Muscle weakness (generalized): Secondary | ICD-10-CM | POA: Diagnosis not present

## 2024-03-21 DIAGNOSIS — I340 Nonrheumatic mitral (valve) insufficiency: Secondary | ICD-10-CM | POA: Diagnosis not present

## 2024-03-22 DIAGNOSIS — E43 Unspecified severe protein-calorie malnutrition: Secondary | ICD-10-CM | POA: Diagnosis not present

## 2024-03-24 DIAGNOSIS — I5040 Unspecified combined systolic (congestive) and diastolic (congestive) heart failure: Secondary | ICD-10-CM | POA: Diagnosis not present

## 2024-03-24 DIAGNOSIS — R7303 Prediabetes: Secondary | ICD-10-CM | POA: Diagnosis not present

## 2024-03-24 DIAGNOSIS — D649 Anemia, unspecified: Secondary | ICD-10-CM | POA: Diagnosis not present

## 2024-04-01 DIAGNOSIS — I5040 Unspecified combined systolic (congestive) and diastolic (congestive) heart failure: Secondary | ICD-10-CM | POA: Diagnosis not present

## 2024-04-01 DIAGNOSIS — I10 Essential (primary) hypertension: Secondary | ICD-10-CM | POA: Diagnosis not present

## 2024-04-01 DIAGNOSIS — E785 Hyperlipidemia, unspecified: Secondary | ICD-10-CM | POA: Diagnosis not present

## 2024-04-03 DIAGNOSIS — I10 Essential (primary) hypertension: Secondary | ICD-10-CM | POA: Diagnosis not present

## 2024-04-03 DIAGNOSIS — E669 Obesity, unspecified: Secondary | ICD-10-CM | POA: Diagnosis not present

## 2024-04-03 DIAGNOSIS — R262 Difficulty in walking, not elsewhere classified: Secondary | ICD-10-CM | POA: Diagnosis not present

## 2024-04-03 DIAGNOSIS — I509 Heart failure, unspecified: Secondary | ICD-10-CM | POA: Diagnosis not present

## 2024-04-14 ENCOUNTER — Encounter: Payer: Self-pay | Admitting: Physician Assistant

## 2024-04-14 ENCOUNTER — Ambulatory Visit (INDEPENDENT_AMBULATORY_CARE_PROVIDER_SITE_OTHER): Admitting: Physician Assistant

## 2024-04-14 VITALS — BP 103/83 | HR 95 | Temp 98.4°F | Resp 16 | Ht 66.0 in | Wt 338.8 lb

## 2024-04-14 DIAGNOSIS — E782 Mixed hyperlipidemia: Secondary | ICD-10-CM | POA: Diagnosis not present

## 2024-04-14 DIAGNOSIS — Z0001 Encounter for general adult medical examination with abnormal findings: Secondary | ICD-10-CM | POA: Diagnosis not present

## 2024-04-14 DIAGNOSIS — I10 Essential (primary) hypertension: Secondary | ICD-10-CM | POA: Diagnosis not present

## 2024-04-14 DIAGNOSIS — I5032 Chronic diastolic (congestive) heart failure: Secondary | ICD-10-CM

## 2024-04-14 MED ORDER — POTASSIUM CHLORIDE CRYS ER 20 MEQ PO TBCR: 20.0000 meq | EXTENDED_RELEASE_TABLET | Freq: Two times a day (BID) | ORAL | 2 refills | Status: AC

## 2024-04-14 MED ORDER — ALLOPURINOL 100 MG PO TABS: 100.0000 mg | ORAL_TABLET | Freq: Every day | ORAL | 1 refills | Status: AC

## 2024-04-14 MED ORDER — ROSUVASTATIN CALCIUM 10 MG PO TABS
10.0000 mg | ORAL_TABLET | Freq: Every day | ORAL | 3 refills | Status: AC
Start: 2024-04-14 — End: ?

## 2024-04-14 MED ORDER — FUROSEMIDE 80 MG PO TABS: ORAL_TABLET | ORAL | 3 refills | Status: DC

## 2024-04-14 NOTE — Progress Notes (Signed)
 [facility] [address] [place], [place] [address]  Internal MEDICINE  Office Visit Note  Patient Name: Bradley Velez  [id number]  [id number]  Date of Service: 04/14/2024  Chief Complaint  Patient presents with   Annual Exam   Hypertension   Hyperlipidemia   Quality Metric Gaps    Colonoscopy   Medication Refill    Potassium and Allopurinol  + Albuterol      HPI Pt is here for routine health maintenance examination -Was in the hospital with acute CHF for 2 weeks then rehab for a week. Finished rehab and has been home for a week. -cardiologist on July 10th with Dr. Diona -Seat for shower, does have walker, but not using. 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   sacubitril-valsartan (ENTRESTO) 24-26 MG Take 1 tablet by mouth 2 (two) times daily.   [DISCONTINUED] allopurinol  (ZYLOPRIM ) 100 MG tablet Take 100 mg by mouth daily.   [DISCONTINUED] furosemide  (LASIX ) 80 MG tablet TAKE 1 TABLET BY MOUTH ONCE DAILY   [DISCONTINUED] guaifenesin  (ROBITUSSIN) 100 MG/5ML syrup Take 200 mg by mouth 3 (three) times daily as needed for cough.   [DISCONTINUED] metFORMIN  (GLUCOPHAGE ) 500 MG tablet Take 1 tablet (500 mg total) by mouth daily with breakfast.   [DISCONTINUED] metoprolol succinate (TOPROL-XL) 25 MG 24 hr tablet Take 25 mg by mouth daily.   [DISCONTINUED] potassium chloride  SA (KLOR-CON  M) 20 MEQ tablet Take 20 mEq by mouth 2 (two) times daily.   [DISCONTINUED] rosuvastatin  (CRESTOR ) 10 MG tablet Take 1 tablet (10 mg total) by mouth daily.   [DISCONTINUED]  spironolactone (ALDACTONE) 25 MG tablet Take 25 mg by mouth daily.   allopurinol  (ZYLOPRIM ) 100 MG tablet Take 1 tablet (100 mg total) by mouth daily.   furosemide  (LASIX ) 80 MG tablet TAKE 1 TABLET BY MOUTH ONCE DAILY   potassium chloride  SA (KLOR-CON  M) 20 MEQ tablet Take 1 tablet (20 mEq total) by mouth 2 (two) times daily.   rosuvastatin  (CRESTOR ) 10 MG tablet Take 1 tablet (10 mg total) by mouth daily.   No facility-administered encounter medications on file as of 04/14/2024.    Surgical History: Past Surgical History:  Procedure Laterality Date   EYE SURGERY Bilateral 1970    Medical History: Past Medical History:  Diagnosis Date   Hyperlipidemia    Hypertension    Obesity    Sleep apnea     Family History: Family History  Problem Relation Age of Onset   Emphysema Father    Lung cancer Father       Review of Systems  Constitutional:  Positive for fatigue. Negative for activity change, chills and unexpected weight change.  HENT:  Negative for congestion, postnasal drip, rhinorrhea, sneezing and sore throat.   Respiratory:  Negative for cough, chest tightness, shortness of breath and wheezing.   Cardiovascular:  Positive for leg swelling. Negative for chest pain and palpitations.  Gastrointestinal:  Negative for abdominal pain, constipation, diarrhea, nausea and vomiting.  Endocrine: Negative for cold intolerance, heat intolerance, polydipsia and polyuria.  Genitourinary:  Negative for dysuria, frequency and urgency.  Musculoskeletal:  Positive for  arthralgias. Negative for back pain, joint swelling and neck pain.  Skin:  Negative for rash.  Allergic/Immunologic: Negative for environmental allergies.  Neurological:  Negative for dizziness, tremors, numbness and headaches.  Hematological:  Negative for adenopathy. Does not bruise/bleed easily.  Psychiatric/Behavioral:  Negative for behavioral problems (Depression), sleep disturbance and suicidal ideas. The patient is  not nervous/anxious.      Vital Signs: BP 103/83   Pulse 95   Temp 98.4 F (36.9 C)   Resp 16   Ht 5' 6 (1.676 m)   Wt (!) 338 lb 12.8 oz (153.7 kg)   SpO2 99%   BMI 54.68 kg/m    Physical Exam Vitals and nursing note reviewed.  Constitutional:      General: He is not in acute distress.    Appearance: Normal appearance. He is well-developed. He is obese. He is not diaphoretic.  HENT:     Head: Normocephalic and atraumatic.     Nose: Nose normal.  Eyes:     Pupils: Pupils are equal, round, and reactive to light.  Neck:     Thyroid : No thyromegaly.     Vascular: No JVD.     Trachea: No tracheal deviation.  Cardiovascular:     Rate and Rhythm: Normal rate and regular rhythm.     Pulses: Normal pulses.     Heart sounds: Normal heart sounds. No murmur heard.    No friction rub. No gallop.  Pulmonary:     Effort: Pulmonary effort is normal. No respiratory distress.     Breath sounds: Normal breath sounds. No wheezing.  Chest:     Chest wall: No tenderness.  Musculoskeletal:     Cervical back: Normal range of motion.  Skin:    General: Skin is warm and dry.  Neurological:     General: No focal deficit present.     Mental Status: He is alert and oriented to person, place, and time.  Psychiatric:        Mood and Affect: Mood normal.        Behavior: Behavior normal.        Thought Content: Thought content normal.        Judgment: Judgment normal.      LABS: No results found for this or any previous visit (from the past 2160 hours).      Assessment/Plan: 1. Encounter for general adult medical examination with abnormal findings (Primary) CPE per labs, lab slip given, patient declines colonoscopy for now, but will reconsider in future  2. Essential hypertension Borderline low in office and will monitor  3. Congestive heart failure with left ventricular diastolic dysfunction, chronic (HCC) Will be seeing cardiology for further management - furosemide   (LASIX ) 80 MG tablet; TAKE 1 TABLET BY MOUTH ONCE DAILY  Dispense: 90 tablet; Refill: 3  4. Mixed hyperlipidemia - rosuvastatin  (CRESTOR ) 10 MG tablet; Take 1 tablet (10 mg total) by mouth daily.  Dispense: 90 tablet; Refill: 3   General Counseling: ascher schroepfer understanding of the findings of todays visit and agrees with plan of treatment. I have discussed any further diagnostic evaluation that may be needed or ordered today. We also reviewed his medications today. he has been encouraged to call the office with any questions or concerns that should arise related to todays visit.    Counseling:    No orders of the defined types were placed in this encounter.   Meds ordered this encounter  Medications   furosemide  (LASIX ) 80 MG tablet  Sig: TAKE 1 TABLET BY MOUTH ONCE DAILY    Dispense:  90 tablet    Refill:  3    Please send a replace/new response with 90-Day Supply if appropriate to maximize member benefit. Requesting 1 year supply.   rosuvastatin  (CRESTOR ) 10 MG tablet    Sig: Take 1 tablet (10 mg total) by mouth daily.    Dispense:  90 tablet    Refill:  3    Please send a replace/new response with 90-Day Supply if appropriate to maximize member benefit. Requesting 1 year supply.   potassium chloride  SA (KLOR-CON  M) 20 MEQ tablet    Sig: Take 1 tablet (20 mEq total) by mouth 2 (two) times daily.    Dispense:  60 tablet    Refill:  2   allopurinol  (ZYLOPRIM ) 100 MG tablet    Sig: Take 1 tablet (100 mg total) by mouth daily.    Dispense:  90 tablet    Refill:  1    This patient was seen by Tinnie Pro, PA-C in collaboration with Dr. Sigrid Bathe as a part of collaborative care agreement.  Total time spent:35 Minutes  Time spent includes review of chart, medications, test results, and follow up plan with the patient.     Sigrid CHRISTELLA Bathe, MD  Internal Medicine

## 2024-04-15 ENCOUNTER — Telehealth: Payer: Self-pay

## 2024-04-15 NOTE — Telephone Encounter (Signed)
 Faxed patient's signed medical release form to [facility].

## 2024-04-16 ENCOUNTER — Telehealth: Payer: Self-pay

## 2024-04-17 NOTE — Telephone Encounter (Signed)
 done

## 2024-05-30 ENCOUNTER — Ambulatory Visit: Admitting: Physician Assistant

## 2024-07-01 DIAGNOSIS — I110 Hypertensive heart disease with heart failure: Secondary | ICD-10-CM | POA: Diagnosis not present

## 2024-07-01 DIAGNOSIS — I509 Heart failure, unspecified: Secondary | ICD-10-CM | POA: Diagnosis not present

## 2024-07-02 DIAGNOSIS — R0600 Dyspnea, unspecified: Secondary | ICD-10-CM | POA: Diagnosis not present

## 2024-07-02 DIAGNOSIS — I5023 Acute on chronic systolic (congestive) heart failure: Secondary | ICD-10-CM | POA: Diagnosis not present

## 2024-07-02 DIAGNOSIS — R918 Other nonspecific abnormal finding of lung field: Secondary | ICD-10-CM | POA: Diagnosis not present

## 2024-07-02 DIAGNOSIS — I517 Cardiomegaly: Secondary | ICD-10-CM | POA: Diagnosis not present

## 2024-07-02 DIAGNOSIS — I340 Nonrheumatic mitral (valve) insufficiency: Secondary | ICD-10-CM | POA: Diagnosis not present

## 2024-07-02 DIAGNOSIS — I110 Hypertensive heart disease with heart failure: Secondary | ICD-10-CM | POA: Diagnosis not present

## 2024-07-02 DIAGNOSIS — J90 Pleural effusion, not elsewhere classified: Secondary | ICD-10-CM | POA: Diagnosis not present

## 2024-07-02 DIAGNOSIS — I5189 Other ill-defined heart diseases: Secondary | ICD-10-CM | POA: Diagnosis not present

## 2024-07-03 DIAGNOSIS — Z91199 Patient's noncompliance with other medical treatment and regimen due to unspecified reason: Secondary | ICD-10-CM | POA: Diagnosis not present

## 2024-07-03 DIAGNOSIS — I454 Nonspecific intraventricular block: Secondary | ICD-10-CM | POA: Diagnosis not present

## 2024-07-03 DIAGNOSIS — I5023 Acute on chronic systolic (congestive) heart failure: Secondary | ICD-10-CM | POA: Diagnosis not present

## 2024-07-03 DIAGNOSIS — I491 Atrial premature depolarization: Secondary | ICD-10-CM | POA: Diagnosis not present

## 2024-07-03 DIAGNOSIS — I340 Nonrheumatic mitral (valve) insufficiency: Secondary | ICD-10-CM | POA: Diagnosis not present

## 2024-07-04 DIAGNOSIS — Z91199 Patient's noncompliance with other medical treatment and regimen due to unspecified reason: Secondary | ICD-10-CM | POA: Diagnosis not present

## 2024-07-04 DIAGNOSIS — I340 Nonrheumatic mitral (valve) insufficiency: Secondary | ICD-10-CM | POA: Diagnosis not present

## 2024-07-04 DIAGNOSIS — I5023 Acute on chronic systolic (congestive) heart failure: Secondary | ICD-10-CM | POA: Diagnosis not present

## 2024-07-05 DIAGNOSIS — I5023 Acute on chronic systolic (congestive) heart failure: Secondary | ICD-10-CM | POA: Diagnosis not present

## 2024-07-06 DIAGNOSIS — I5023 Acute on chronic systolic (congestive) heart failure: Secondary | ICD-10-CM | POA: Diagnosis not present

## 2024-07-07 ENCOUNTER — Ambulatory Visit: Admitting: Physician Assistant

## 2024-07-07 DIAGNOSIS — I5023 Acute on chronic systolic (congestive) heart failure: Secondary | ICD-10-CM | POA: Diagnosis not present

## 2024-07-08 DIAGNOSIS — I5023 Acute on chronic systolic (congestive) heart failure: Secondary | ICD-10-CM | POA: Diagnosis not present

## 2024-07-09 DIAGNOSIS — M25561 Pain in right knee: Secondary | ICD-10-CM | POA: Diagnosis not present

## 2024-07-11 DIAGNOSIS — I5023 Acute on chronic systolic (congestive) heart failure: Secondary | ICD-10-CM | POA: Diagnosis not present

## 2024-07-13 DIAGNOSIS — I5023 Acute on chronic systolic (congestive) heart failure: Secondary | ICD-10-CM | POA: Diagnosis not present

## 2024-07-14 DIAGNOSIS — I5023 Acute on chronic systolic (congestive) heart failure: Secondary | ICD-10-CM | POA: Diagnosis not present

## 2024-07-14 DIAGNOSIS — I517 Cardiomegaly: Secondary | ICD-10-CM | POA: Diagnosis not present

## 2024-07-15 DIAGNOSIS — I110 Hypertensive heart disease with heart failure: Secondary | ICD-10-CM | POA: Diagnosis not present

## 2024-07-15 DIAGNOSIS — I340 Nonrheumatic mitral (valve) insufficiency: Secondary | ICD-10-CM | POA: Diagnosis not present

## 2024-07-15 DIAGNOSIS — I5023 Acute on chronic systolic (congestive) heart failure: Secondary | ICD-10-CM | POA: Diagnosis not present

## 2024-07-15 DIAGNOSIS — Z91199 Patient's noncompliance with other medical treatment and regimen due to unspecified reason: Secondary | ICD-10-CM | POA: Diagnosis not present

## 2024-07-16 DIAGNOSIS — Z91199 Patient's noncompliance with other medical treatment and regimen due to unspecified reason: Secondary | ICD-10-CM | POA: Diagnosis not present

## 2024-07-16 DIAGNOSIS — I110 Hypertensive heart disease with heart failure: Secondary | ICD-10-CM | POA: Diagnosis not present

## 2024-07-16 DIAGNOSIS — I5023 Acute on chronic systolic (congestive) heart failure: Secondary | ICD-10-CM | POA: Diagnosis not present

## 2024-07-16 DIAGNOSIS — I340 Nonrheumatic mitral (valve) insufficiency: Secondary | ICD-10-CM | POA: Diagnosis not present

## 2024-07-20 DIAGNOSIS — I5023 Acute on chronic systolic (congestive) heart failure: Secondary | ICD-10-CM | POA: Diagnosis not present

## 2024-07-21 DIAGNOSIS — I5023 Acute on chronic systolic (congestive) heart failure: Secondary | ICD-10-CM | POA: Diagnosis not present

## 2024-07-23 DIAGNOSIS — I110 Hypertensive heart disease with heart failure: Secondary | ICD-10-CM | POA: Diagnosis not present

## 2024-07-23 DIAGNOSIS — I5020 Unspecified systolic (congestive) heart failure: Secondary | ICD-10-CM | POA: Diagnosis not present

## 2024-07-23 DIAGNOSIS — G4733 Obstructive sleep apnea (adult) (pediatric): Secondary | ICD-10-CM | POA: Diagnosis not present

## 2024-07-30 NOTE — Progress Notes (Signed)
 TeleHospitalist Progress note  Patient was scheduled for virtual visit as a part of post discharge follow up from recent hospital admission   SYNOPSIS:  [name] is a 58 yo M with PMH significant for non ischemic cardiomyopathy, hypertension, gout and OSA who was admitted from 9/10-9/29 at [facility] then through IP [facility] for acute on chronic HFrEF. Diuresis and GDMT limited due to soft BP and low cardiac output. Hospital course complicated by AKI secondary to aggressive diuresis however improved by discharge. Transitioned to Tosremide 40 mg daily and SGLT2 at discharge. Holding Aldactone and Entresto, beta blockade stopped due to low cardiac output. [facility] engaged for ongoing volume assessment and possible resumption of GDMT.   Medic visits:  10/2: Worsening edema, Metolazone 2.5 mg added prior to Torsemide. Doreen stopped. 10/6: Weight and swelling improving. Stopped Metolazone, started K supplementation.     SUBJECTIVE/ OBJECTIVE: Wed 07/30/2024   Vitals: BP: 105/83, Temp: 97.3 F (36.3 C), Temp Source: Temporal, Heart Rate: 94, Resp: (!) 22, SpO2: 96 %, Weight: (!) 150 kg (330 lb);  Medic exam:  Edema measurements: 18.5 inches on LLE, 16.5 inches on RLE  Med rec: see media tab for med rec, SGLT2 stopped by PCP, holding ARNI/MRA  Wt Readings from Last 3 Encounters:  07/30/24 (!) 150 kg (330 lb)  07/28/24 (!) 152 kg (334 lb)  07/23/24 (!) 153 kg (336 lb 12.8 oz)   Patient reports he is not sleeping well and having dyspnea with exertion. Weight is down, lower extremity edema is about the same (measuring his legs daily). At night he is having some left sided chest pain, improved with Tylenol . Comes back intermittently but always at rest. Worse when he is recumbent. No heaviness or radiation of the pain. Does not occur when ambulating/exerting. Goes away with Tylenol . Not pleuritic. No lightheadedness or dizziness. Will add Metolazone back in- refill sent to pharmacy.  Has plenty of potassium at home. Not taking Farxiga- this was stopped by his PCP.   Patient is concerned about losing his insurance later this month since he has been out of work for a long time. Will have case management reach out to patient with resources and send 90 day refills of prescriptions needed long term.    ASSESSMENT/ PLAN:    #HFrEF, acute on chronic #AKI, resolved - Cr back to baseline ~ 1 at last check - TTE with EF < 15% - GDMT (limited due to low cardiac output and hypotension):holding Entresto & Aldactone, no BB, Farxiga stopped by PCP (doesn't have at home) - Diuretics: Torsemide 40 mg daily, PRN Metolazone for weight gain, dyspnea, lower extremity edema - Even with improvement in weight patient seems volume up- add back daily Metolazone - BMP, BNP, Mg today  Chest pain noted does not seem to be cardiac. Possibly secondary to pulmonary edema vs MSK given improvement with Tylenol . Discussed reasons to call EMS including worsening pain with exertion, heaviness, pain not relieved with Tylenol  or sharp pleuritic pain.   Heart Failure clinic follow up 10/16  Afternoon addendum: Labs show critical potassium of 2.7. Will have patient take 40 mEq potassium now, take 20 mEq before bed. Start 20 mEq twice daily tomorrow. BNP trending up, renal function stable. Follow up on Friday 10/10 for repeat BMP and ongoing CHF management. Updated patient over the phone.     1. Acute on chronic HFrEF (heart failure with reduced ejection fraction)    (CMD)  Future Appointments  Date Time Provider Department Center  08/07/2024  1:00 PM Jolaine Nat Ferretti, NP [facility] [address]      Location Information: Patient State (at time of visit):   Patient Location (at time of visit):Home/Other Non-Medical  Provider Location:   Is provider licensed to provide clinical care in the current location/state of the patient? Yes  Consent:  Patient's identity was  confirmed. Presenting condition or illness was discussed with the patient/personal representative. Current proposed treatment for presenting condition or illness was explained to patient/personal representative along with the likely benefits and any significant risks or complications associated with the provision of treatment by audio/video means. The patient/personal representative verbally authorized treatment to be provided by audio/video, which may include a limited review of patient's current health status, medication, or other treatment recommendations, patient education, and an opportunity to ask questions about condition and treatment. Verbal Consent Granted by Patient/Personal Representative:Yes   I have personally spent 39 minutes involved in face-to-face and non-face-to-face activities for this patient on the day of the visit.  Professional time spent includes the following activities, in addition to those noted in the documentation: Chart review, documentation, blood work/results review and care coordination  Visit Information: Modality: 2-Way Real-Time Audio/Video  12:08-12:19 PM, 11 minutes

## 2024-07-31 ENCOUNTER — Encounter: Payer: Self-pay | Admitting: Physician Assistant

## 2024-07-31 ENCOUNTER — Ambulatory Visit (INDEPENDENT_AMBULATORY_CARE_PROVIDER_SITE_OTHER): Admitting: Physician Assistant

## 2024-07-31 VITALS — BP 107/75 | HR 96 | Temp 98.2°F | Resp 16 | Ht 66.0 in | Wt 334.0 lb

## 2024-07-31 DIAGNOSIS — I10 Essential (primary) hypertension: Secondary | ICD-10-CM | POA: Diagnosis not present

## 2024-07-31 DIAGNOSIS — Z09 Encounter for follow-up examination after completed treatment for conditions other than malignant neoplasm: Secondary | ICD-10-CM

## 2024-07-31 DIAGNOSIS — I5023 Acute on chronic systolic (congestive) heart failure: Secondary | ICD-10-CM | POA: Diagnosis not present

## 2024-07-31 NOTE — Progress Notes (Signed)
 [facility] [address] [place], [place] [address]  Internal MEDICINE  Office Visit Note  Patient Name: Bradley Velez  [id number]  [id number]  Date of Service: 07/31/2024     Chief Complaint  Patient presents with   Follow-up   Hyperlipidemia   Hypertension     HPI Pt is here for recent hospital follow up. -recently hospitalized for CHF, 07/01/24-07/21/24 -getting lab draws every other day, trouble with potassium levels while keeping fluid off -home health is doing this -BP doing ok, but did have trouble with it dropping low -Still in PT to help with walking -switched from lasix  to torsemide and metolazone -with all of this going on has not been feeling 100% still. Still gets winded walking despite fluid removal so far -states they want to place a pacemaker but want to optimize health first -doesn't feel he could work full time right now with how he is feeling. States he has to go back by the 21st to keep insurance but is discussing with work. Looking into medicaid but states it takes 45days to review. Also looking to social security disability but also would be a long time -He is going to discuss with heart failure clinic on 10/16 as well and see if they also agree he should not go back to work yet pending updated labs. Needs form for work faxed by 10/21  Current Medication: Outpatient Encounter Medications as of 07/31/2024  Medication Sig   albuterol  (VENTOLIN  HFA) 108 (90 Base) MCG/ACT inhaler Inhale 2 puffs into the lungs every 6 (six) hours as needed for wheezing or shortness of breath.   allopurinol  (ZYLOPRIM ) 100 MG tablet Take 1 tablet (100 mg total) by mouth daily.   metolazone (ZAROXOLYN) 2.5 MG tablet Take 2.5 mg by mouth daily.   potassium chloride  SA (KLOR-CON  M) 20 MEQ tablet Take 1 tablet (20 mEq total) by mouth 2 (two) times daily.   rosuvastatin  (CRESTOR ) 10 MG tablet Take 1 tablet (10 mg total) by mouth daily.   sacubitril-valsartan (ENTRESTO)  24-26 MG Take 1 tablet by mouth 2 (two) times daily.   torsemide (DEMADEX) 20 MG tablet Take 20 mg by mouth 2 (two) times daily.   [DISCONTINUED] furosemide  (LASIX ) 80 MG tablet TAKE 1 TABLET BY MOUTH ONCE DAILY   No facility-administered encounter medications on file as of 07/31/2024.    Surgical History: Past Surgical History:  Procedure Laterality Date   EYE SURGERY Bilateral 1970    Medical History: Past Medical History:  Diagnosis Date   Hyperlipidemia    Hypertension    Obesity    Sleep apnea     Family History: Family History  Problem Relation Age of Onset   Emphysema Father    Lung cancer Father     Social History   Socioeconomic History   Marital status: Divorced    Spouse name: Not on file   Number of children: Not on file   Years of education: Not on file   Highest education level: Not on file  Occupational History   Not on file  Tobacco Use   Smoking status: Never   Smokeless tobacco: Never  Substance and Sexual Activity   Alcohol use: Yes    Comment: social   Drug use: No   Sexual activity: Not on file  Other Topics Concern   Not on file  Social History Narrative   Not on file   Social Drivers of Health   Financial Resource Strain: Not on file  Food  Insecurity: Low Risk  (03/09/2024)   Received from [facility]   Hunger Vital Sign    Within the past 12 months, you worried that your food would run out before you got money to buy more: Never true    Within the past 12 months, the food you bought just didn't last and you didn't have money to get more. : Never true  Transportation Needs: No Transportation Needs (03/09/2024)   Received from [facility]    In the past 12 months, has lack of reliable transportation kept you from medical appointments, meetings, work or from getting things needed for daily living? : No  Physical Activity: Not on file  Stress: Not on file  Social Connections: Not on file  Intimate Partner  Violence: Not on file      Review of Systems  Constitutional:  Positive for activity change and fatigue. Negative for chills and unexpected weight change.  HENT:  Negative for congestion, postnasal drip, rhinorrhea, sneezing and sore throat.   Respiratory:  Positive for shortness of breath. Negative for cough, chest tightness and wheezing.   Cardiovascular:  Positive for leg swelling. Negative for chest pain and palpitations.  Gastrointestinal:  Negative for abdominal pain, constipation, diarrhea, nausea and vomiting.  Endocrine: Negative for cold intolerance, heat intolerance, polydipsia and polyuria.  Genitourinary:  Negative for dysuria, frequency and urgency.  Musculoskeletal:  Positive for arthralgias. Negative for back pain, joint swelling and neck pain.  Skin:  Negative for rash.  Allergic/Immunologic: Negative for environmental allergies.  Neurological:  Positive for weakness. Negative for dizziness, tremors, numbness and headaches.  Hematological:  Negative for adenopathy. Does not bruise/bleed easily.  Psychiatric/Behavioral:  Negative for behavioral problems (Depression) and suicidal ideas. The patient is nervous/anxious.     Vital Signs: BP 107/75   Pulse 96   Temp 98.2 F (36.8 C)   Resp 16   Ht 5' 6 (1.676 m)   Wt (!) 334 lb (151.5 kg)   SpO2 96%   BMI 53.91 kg/m    Physical Exam Vitals and nursing note reviewed.  Constitutional:      General: He is not in acute distress.    Appearance: He is well-developed. He is obese. He is not diaphoretic.  HENT:     Head: Normocephalic and atraumatic.  Eyes:     Extraocular Movements: Extraocular movements intact.  Neck:     Thyroid : No thyromegaly.     Vascular: No JVD.     Trachea: No tracheal deviation.  Cardiovascular:     Rate and Rhythm: Normal rate and regular rhythm.     Pulses: Normal pulses.     Heart sounds: Normal heart sounds. No murmur heard.    No friction rub. No gallop.  Pulmonary:     Effort:  Pulmonary effort is normal. No respiratory distress.     Breath sounds: Normal breath sounds. No wheezing.  Chest:     Chest wall: No tenderness.  Musculoskeletal:     Right lower leg: Edema present.     Left lower leg: Edema present.  Skin:    General: Skin is warm and dry.  Neurological:     General: No focal deficit present.     Mental Status: He is alert and oriented to person, place, and time.  Psychiatric:        Behavior: Behavior normal.        Thought Content: Thought content normal.        Judgment: Judgment  normal.       Assessment/Plan: 1. Acute on chronic HFrEF (heart failure with reduced ejection fraction) (HCC) (Primary) Followed by cardiology/heart failure clinic  2. Hospital discharge follow-up Reviewed hospital course and med changes  3. Essential hypertension Being closely monitored by cardiology on diuretics    General Counseling: meer reindl understanding of the findings of todays visit and agrees with plan of treatment. I have discussed any further diagnostic evaluation that may be needed or ordered today. We also reviewed his medications today. he has been encouraged to call the office with any questions or concerns that should arise related to todays visit.    Counseling:    No orders of the defined types were placed in this encounter.   This patient was seen by Tinnie Pro, PA-C in collaboration with Dr. Sigrid Bathe as a part of collaborative care agreement.   I have reviewed all medical records from hospital follow up including radiology reports and consults from other physicians. Appropriate follow up diagnostics will be scheduled as needed. Patient/ Family understands the plan of treatment. Time spent 35 minutes.   Dr Sigrid CHRISTELLA Bathe, MD Internal Medicine

## 2024-08-07 ENCOUNTER — Encounter: Payer: Self-pay | Admitting: Physician Assistant

## 2024-08-07 DIAGNOSIS — I5020 Unspecified systolic (congestive) heart failure: Secondary | ICD-10-CM | POA: Diagnosis not present

## 2024-08-12 ENCOUNTER — Telehealth: Payer: Self-pay | Admitting: Physician Assistant

## 2024-08-12 NOTE — Telephone Encounter (Signed)
 Clearance completed and faxed back to [facility]; [phone]. Scanned-Toni

## 2024-12-01 ENCOUNTER — Ambulatory Visit: Admitting: Physician Assistant

## 2025-04-16 ENCOUNTER — Encounter: Admitting: Physician Assistant
